# Patient Record
Sex: Female | Born: 1945 | Race: Black or African American | Hispanic: No | Smoking: Former smoker
Health system: Southern US, Community
[De-identification: ages and names within clinical notes are randomized; demographics above are authoritative.]

## PROBLEM LIST (undated history)

## (undated) DIAGNOSIS — R112 Nausea with vomiting, unspecified: Secondary | ICD-10-CM

## (undated) DIAGNOSIS — I10 Essential (primary) hypertension: Secondary | ICD-10-CM

## (undated) DIAGNOSIS — Z9889 Other specified postprocedural states: Secondary | ICD-10-CM

## (undated) DIAGNOSIS — J449 Chronic obstructive pulmonary disease, unspecified: Secondary | ICD-10-CM

## (undated) DIAGNOSIS — I509 Heart failure, unspecified: Secondary | ICD-10-CM

## (undated) DIAGNOSIS — K219 Gastro-esophageal reflux disease without esophagitis: Secondary | ICD-10-CM

## (undated) DIAGNOSIS — E039 Hypothyroidism, unspecified: Secondary | ICD-10-CM

## (undated) DIAGNOSIS — T8859XA Other complications of anesthesia, initial encounter: Secondary | ICD-10-CM

## (undated) DIAGNOSIS — D571 Sickle-cell disease without crisis: Secondary | ICD-10-CM

## (undated) DIAGNOSIS — E079 Disorder of thyroid, unspecified: Secondary | ICD-10-CM

## (undated) DIAGNOSIS — R0602 Shortness of breath: Secondary | ICD-10-CM

## (undated) DIAGNOSIS — T4145XA Adverse effect of unspecified anesthetic, initial encounter: Secondary | ICD-10-CM

## (undated) DIAGNOSIS — I639 Cerebral infarction, unspecified: Secondary | ICD-10-CM

## (undated) DIAGNOSIS — H6690 Otitis media, unspecified, unspecified ear: Secondary | ICD-10-CM

## (undated) DIAGNOSIS — E785 Hyperlipidemia, unspecified: Secondary | ICD-10-CM

## (undated) DIAGNOSIS — Z972 Presence of dental prosthetic device (complete) (partial): Secondary | ICD-10-CM

## (undated) DIAGNOSIS — Z973 Presence of spectacles and contact lenses: Secondary | ICD-10-CM

## (undated) DIAGNOSIS — K5730 Diverticulosis of large intestine without perforation or abscess without bleeding: Secondary | ICD-10-CM

## (undated) DIAGNOSIS — F329 Major depressive disorder, single episode, unspecified: Secondary | ICD-10-CM

## (undated) DIAGNOSIS — J349 Unspecified disorder of nose and nasal sinuses: Secondary | ICD-10-CM

## (undated) DIAGNOSIS — F32A Depression, unspecified: Secondary | ICD-10-CM

## (undated) DIAGNOSIS — F419 Anxiety disorder, unspecified: Secondary | ICD-10-CM

## (undated) HISTORY — DX: Hyperlipidemia, unspecified: E78.5

## (undated) HISTORY — PX: BILATERAL OOPHORECTOMY: SHX1221

## (undated) HISTORY — DX: Cerebral infarction, unspecified: I63.9

## (undated) HISTORY — DX: Heart failure, unspecified: I50.9

## (undated) HISTORY — PX: BLADDER SUSPENSION: SHX72

## (undated) HISTORY — PX: BREAST EXCISIONAL BIOPSY: SUR124

## (undated) HISTORY — DX: Otitis media, unspecified, unspecified ear: H66.90

## (undated) HISTORY — DX: Disorder of thyroid, unspecified: E07.9

## (undated) HISTORY — DX: Essential (primary) hypertension: I10

## (undated) HISTORY — PX: ABDOMINAL HYSTERECTOMY: SHX81

## (undated) HISTORY — DX: Diverticulosis of large intestine without perforation or abscess without bleeding: K57.30

## (undated) HISTORY — DX: Unspecified disorder of nose and nasal sinuses: J34.9

---

## 1983-09-05 HISTORY — PX: THYROIDECTOMY, PARTIAL: SHX18

## 1998-11-02 ENCOUNTER — Ambulatory Visit (HOSPITAL_COMMUNITY): Admission: RE | Admit: 1998-11-02 | Discharge: 1998-11-02 | Payer: Self-pay | Admitting: *Deleted

## 1998-11-02 ENCOUNTER — Encounter: Payer: Self-pay | Admitting: *Deleted

## 1998-11-03 ENCOUNTER — Ambulatory Visit (HOSPITAL_COMMUNITY): Admission: RE | Admit: 1998-11-03 | Discharge: 1998-11-03 | Payer: Self-pay | Admitting: *Deleted

## 1998-11-08 ENCOUNTER — Ambulatory Visit (HOSPITAL_COMMUNITY): Admission: RE | Admit: 1998-11-08 | Discharge: 1998-11-08 | Payer: Self-pay | Admitting: *Deleted

## 1998-11-08 ENCOUNTER — Other Ambulatory Visit: Admission: RE | Admit: 1998-11-08 | Discharge: 1998-11-08 | Payer: Self-pay | Admitting: Obstetrics

## 1998-11-25 ENCOUNTER — Encounter: Payer: Self-pay | Admitting: *Deleted

## 1998-11-25 ENCOUNTER — Ambulatory Visit (HOSPITAL_COMMUNITY): Admission: RE | Admit: 1998-11-25 | Discharge: 1998-11-25 | Payer: Self-pay | Admitting: *Deleted

## 2003-01-08 ENCOUNTER — Emergency Department (HOSPITAL_COMMUNITY): Admission: EM | Admit: 2003-01-08 | Discharge: 2003-01-08 | Payer: Self-pay | Admitting: Emergency Medicine

## 2003-06-03 ENCOUNTER — Encounter: Payer: Self-pay | Admitting: Internal Medicine

## 2003-06-03 ENCOUNTER — Ambulatory Visit (HOSPITAL_COMMUNITY): Admission: RE | Admit: 2003-06-03 | Discharge: 2003-06-03 | Payer: Self-pay | Admitting: Internal Medicine

## 2003-06-08 ENCOUNTER — Emergency Department (HOSPITAL_COMMUNITY): Admission: EM | Admit: 2003-06-08 | Discharge: 2003-06-08 | Payer: Self-pay | Admitting: Emergency Medicine

## 2003-06-08 ENCOUNTER — Encounter: Payer: Self-pay | Admitting: Emergency Medicine

## 2003-07-21 ENCOUNTER — Emergency Department (HOSPITAL_COMMUNITY): Admission: EM | Admit: 2003-07-21 | Discharge: 2003-07-21 | Payer: Self-pay | Admitting: Emergency Medicine

## 2003-08-07 ENCOUNTER — Ambulatory Visit (HOSPITAL_COMMUNITY): Admission: RE | Admit: 2003-08-07 | Discharge: 2003-08-07 | Payer: Self-pay | Admitting: Internal Medicine

## 2003-12-01 ENCOUNTER — Ambulatory Visit (HOSPITAL_COMMUNITY): Admission: RE | Admit: 2003-12-01 | Discharge: 2003-12-01 | Payer: Self-pay | Admitting: Internal Medicine

## 2004-02-24 ENCOUNTER — Ambulatory Visit (HOSPITAL_COMMUNITY): Admission: RE | Admit: 2004-02-24 | Discharge: 2004-02-24 | Payer: Self-pay | Admitting: Internal Medicine

## 2004-05-10 ENCOUNTER — Ambulatory Visit: Payer: Self-pay | Admitting: *Deleted

## 2004-06-03 ENCOUNTER — Ambulatory Visit: Payer: Self-pay | Admitting: Family Medicine

## 2004-07-14 ENCOUNTER — Ambulatory Visit: Payer: Self-pay | Admitting: Family Medicine

## 2004-12-14 ENCOUNTER — Ambulatory Visit: Payer: Self-pay | Admitting: Internal Medicine

## 2004-12-19 ENCOUNTER — Ambulatory Visit: Payer: Self-pay | Admitting: Family Medicine

## 2004-12-21 ENCOUNTER — Ambulatory Visit: Payer: Self-pay | Admitting: Family Medicine

## 2005-01-17 ENCOUNTER — Ambulatory Visit: Payer: Self-pay | Admitting: Family Medicine

## 2005-03-14 ENCOUNTER — Ambulatory Visit (HOSPITAL_COMMUNITY): Admission: RE | Admit: 2005-03-14 | Discharge: 2005-03-14 | Payer: Self-pay | Admitting: Internal Medicine

## 2005-04-27 ENCOUNTER — Ambulatory Visit: Payer: Self-pay | Admitting: Family Medicine

## 2005-05-03 ENCOUNTER — Ambulatory Visit: Payer: Self-pay | Admitting: Internal Medicine

## 2005-09-25 ENCOUNTER — Ambulatory Visit: Payer: Self-pay | Admitting: Family Medicine

## 2005-10-30 ENCOUNTER — Ambulatory Visit: Payer: Self-pay | Admitting: Family Medicine

## 2005-10-31 ENCOUNTER — Ambulatory Visit (HOSPITAL_COMMUNITY): Admission: RE | Admit: 2005-10-31 | Discharge: 2005-10-31 | Payer: Self-pay | Admitting: Internal Medicine

## 2006-03-22 ENCOUNTER — Ambulatory Visit: Payer: Self-pay | Admitting: Family Medicine

## 2006-04-04 ENCOUNTER — Ambulatory Visit: Payer: Self-pay | Admitting: Family Medicine

## 2006-04-16 ENCOUNTER — Ambulatory Visit: Payer: Self-pay | Admitting: Family Medicine

## 2006-05-28 ENCOUNTER — Ambulatory Visit: Payer: Self-pay | Admitting: Family Medicine

## 2006-06-04 ENCOUNTER — Ambulatory Visit (HOSPITAL_COMMUNITY): Admission: RE | Admit: 2006-06-04 | Discharge: 2006-06-04 | Payer: Self-pay | Admitting: Internal Medicine

## 2006-10-23 ENCOUNTER — Ambulatory Visit: Payer: Self-pay | Admitting: Family Medicine

## 2006-11-16 ENCOUNTER — Ambulatory Visit: Payer: Self-pay | Admitting: Family Medicine

## 2006-12-18 ENCOUNTER — Ambulatory Visit: Payer: Self-pay | Admitting: Family Medicine

## 2007-02-04 ENCOUNTER — Ambulatory Visit (HOSPITAL_COMMUNITY): Admission: RE | Admit: 2007-02-04 | Discharge: 2007-02-04 | Payer: Self-pay | Admitting: Urology

## 2007-02-12 ENCOUNTER — Ambulatory Visit: Payer: Self-pay | Admitting: Family Medicine

## 2007-03-07 DIAGNOSIS — I10 Essential (primary) hypertension: Secondary | ICD-10-CM | POA: Insufficient documentation

## 2007-03-07 DIAGNOSIS — E039 Hypothyroidism, unspecified: Secondary | ICD-10-CM | POA: Insufficient documentation

## 2007-03-07 DIAGNOSIS — J309 Allergic rhinitis, unspecified: Secondary | ICD-10-CM | POA: Insufficient documentation

## 2007-03-07 DIAGNOSIS — E785 Hyperlipidemia, unspecified: Secondary | ICD-10-CM | POA: Insufficient documentation

## 2007-03-07 DIAGNOSIS — F411 Generalized anxiety disorder: Secondary | ICD-10-CM | POA: Insufficient documentation

## 2007-03-07 DIAGNOSIS — J45909 Unspecified asthma, uncomplicated: Secondary | ICD-10-CM | POA: Insufficient documentation

## 2007-03-07 DIAGNOSIS — K219 Gastro-esophageal reflux disease without esophagitis: Secondary | ICD-10-CM | POA: Insufficient documentation

## 2007-03-07 DIAGNOSIS — F329 Major depressive disorder, single episode, unspecified: Secondary | ICD-10-CM | POA: Insufficient documentation

## 2007-03-20 ENCOUNTER — Ambulatory Visit: Payer: Self-pay | Admitting: Internal Medicine

## 2007-03-27 ENCOUNTER — Ambulatory Visit (HOSPITAL_COMMUNITY): Admission: RE | Admit: 2007-03-27 | Discharge: 2007-03-27 | Payer: Self-pay | Admitting: Internal Medicine

## 2007-04-05 ENCOUNTER — Ambulatory Visit: Payer: Self-pay | Admitting: Internal Medicine

## 2007-05-01 ENCOUNTER — Ambulatory Visit: Payer: Self-pay | Admitting: Obstetrics & Gynecology

## 2007-05-03 ENCOUNTER — Ambulatory Visit (HOSPITAL_COMMUNITY): Admission: RE | Admit: 2007-05-03 | Discharge: 2007-05-03 | Payer: Self-pay | Admitting: Family Medicine

## 2007-05-08 ENCOUNTER — Ambulatory Visit: Payer: Self-pay | Admitting: *Deleted

## 2007-05-22 ENCOUNTER — Encounter (INDEPENDENT_AMBULATORY_CARE_PROVIDER_SITE_OTHER): Payer: Self-pay | Admitting: *Deleted

## 2007-06-07 ENCOUNTER — Ambulatory Visit (HOSPITAL_COMMUNITY): Admission: RE | Admit: 2007-06-07 | Discharge: 2007-06-07 | Payer: Self-pay | Admitting: Family Medicine

## 2007-07-04 ENCOUNTER — Encounter (INDEPENDENT_AMBULATORY_CARE_PROVIDER_SITE_OTHER): Payer: Self-pay | Admitting: Family Medicine

## 2007-07-04 ENCOUNTER — Ambulatory Visit: Payer: Self-pay | Admitting: Internal Medicine

## 2007-07-04 LAB — CONVERTED CEMR LAB
Albumin: 4.4 g/dL (ref 3.5–5.2)
BUN: 14 mg/dL (ref 6–23)
Basophils Absolute: 0.1 10*3/uL (ref 0.0–0.1)
CO2: 22 meq/L (ref 19–32)
Calcium: 9.2 mg/dL (ref 8.4–10.5)
Chloride: 105 meq/L (ref 96–112)
Eosinophils Relative: 2 % (ref 0–5)
HCT: 45.2 % (ref 36.0–46.0)
HDL: 37 mg/dL — ABNORMAL LOW (ref >39)
Hemoglobin: 14.3 g/dL (ref 12.0–15.0)
MCHC: 31.6 g/dL (ref 30.0–36.0)
MCV: 91.3 fL (ref 78.0–100.0)
Neutro Abs: 3.3 10*3/uL (ref 1.7–7.7)
Neutrophils Relative %: 38 % — ABNORMAL LOW (ref 43–77)
Platelets: 218 10*3/uL (ref 150–400)
Potassium: 4.2 meq/L (ref 3.5–5.3)
RBC: 4.95 M/uL (ref 3.87–5.11)
RDW: 14.2 % — ABNORMAL HIGH (ref 11.5–14.0)
Sodium: 140 meq/L (ref 135–145)
Total CHOL/HDL Ratio: 7
Total Protein: 7.4 g/dL (ref 6.0–8.3)
Triglycerides: 183 mg/dL — ABNORMAL HIGH (ref <150)
WBC: 8.8 10*3/uL (ref 4.0–10.5)

## 2007-07-29 ENCOUNTER — Ambulatory Visit: Payer: Self-pay | Admitting: Internal Medicine

## 2007-11-06 ENCOUNTER — Ambulatory Visit (HOSPITAL_COMMUNITY): Admission: RE | Admit: 2007-11-06 | Discharge: 2007-11-06 | Payer: Self-pay | Admitting: Obstetrics & Gynecology

## 2007-11-06 ENCOUNTER — Ambulatory Visit: Payer: Self-pay | Admitting: Obstetrics & Gynecology

## 2007-12-23 ENCOUNTER — Encounter (INDEPENDENT_AMBULATORY_CARE_PROVIDER_SITE_OTHER): Payer: Self-pay | Admitting: Family Medicine

## 2007-12-23 ENCOUNTER — Ambulatory Visit: Payer: Self-pay | Admitting: Internal Medicine

## 2007-12-23 LAB — CONVERTED CEMR LAB
AST: 18 units/L (ref 0–37)
Albumin: 4.2 g/dL (ref 3.5–5.2)
BUN: 13 mg/dL (ref 6–23)
CO2: 23 meq/L (ref 19–32)
Calcium: 9.5 mg/dL (ref 8.4–10.5)
Creatinine, Ser: 0.8 mg/dL (ref 0.40–1.20)
Eosinophils Absolute: 0.3 10*3/uL (ref 0.0–0.7)
HDL: 40 mg/dL (ref >39)
Helicobacter Pylori Antibody-IgG: 3.1 — ABNORMAL HIGH
LDL Cholesterol: 134 mg/dL — ABNORMAL HIGH (ref 0–99)
Lymphocytes Relative: 50 % — ABNORMAL HIGH (ref 12–46)
Lymphs Abs: 3.9 10*3/uL (ref 0.7–4.0)
MCHC: 32.7 g/dL (ref 30.0–36.0)
Monocytes Relative: 3 % (ref 3–12)
Neutrophils Relative %: 42 % — ABNORMAL LOW (ref 43–77)
Platelets: 198 10*3/uL (ref 150–400)
Potassium: 4.1 meq/L (ref 3.5–5.3)
RBC: 4.59 M/uL (ref 3.87–5.11)
RDW: 13.2 % (ref 11.5–15.5)
Sodium: 142 meq/L (ref 135–145)
Total Bilirubin: 0.4 mg/dL (ref 0.3–1.2)
Total CHOL/HDL Ratio: 5.2
Triglycerides: 162 mg/dL — ABNORMAL HIGH (ref <150)

## 2007-12-31 ENCOUNTER — Encounter: Payer: Self-pay | Admitting: Obstetrics & Gynecology

## 2007-12-31 ENCOUNTER — Ambulatory Visit: Payer: Self-pay | Admitting: Obstetrics & Gynecology

## 2007-12-31 ENCOUNTER — Inpatient Hospital Stay (HOSPITAL_COMMUNITY): Admission: RE | Admit: 2007-12-31 | Discharge: 2008-01-02 | Payer: Self-pay | Admitting: Obstetrics & Gynecology

## 2008-01-08 ENCOUNTER — Ambulatory Visit: Payer: Self-pay | Admitting: Obstetrics and Gynecology

## 2008-01-23 ENCOUNTER — Ambulatory Visit: Payer: Self-pay | Admitting: Obstetrics & Gynecology

## 2008-01-31 ENCOUNTER — Ambulatory Visit (HOSPITAL_COMMUNITY): Admission: RE | Admit: 2008-01-31 | Discharge: 2008-01-31 | Payer: Self-pay | Admitting: Family Medicine

## 2008-02-13 ENCOUNTER — Encounter (INDEPENDENT_AMBULATORY_CARE_PROVIDER_SITE_OTHER): Payer: Self-pay | Admitting: Family Medicine

## 2008-02-13 ENCOUNTER — Ambulatory Visit: Payer: Self-pay | Admitting: Internal Medicine

## 2008-02-13 LAB — CONVERTED CEMR LAB
Free T4: 1.22 ng/dL (ref 0.89–1.80)
HCV Ab: NEGATIVE
Hep A Total Ab: POSITIVE — AB
Sed Rate: 22 mm/hr (ref 0–22)
TSH: 0.877 microintl units/mL (ref 0.350–5.50)

## 2008-02-14 ENCOUNTER — Emergency Department (HOSPITAL_COMMUNITY): Admission: EM | Admit: 2008-02-14 | Discharge: 2008-02-14 | Payer: Self-pay | Admitting: Emergency Medicine

## 2008-03-09 ENCOUNTER — Emergency Department (HOSPITAL_COMMUNITY): Admission: EM | Admit: 2008-03-09 | Discharge: 2008-03-09 | Payer: Self-pay | Admitting: Emergency Medicine

## 2008-03-10 ENCOUNTER — Ambulatory Visit: Payer: Self-pay | Admitting: Internal Medicine

## 2008-03-23 ENCOUNTER — Ambulatory Visit: Payer: Self-pay | Admitting: Internal Medicine

## 2008-04-24 ENCOUNTER — Ambulatory Visit: Payer: Self-pay | Admitting: Internal Medicine

## 2008-05-25 ENCOUNTER — Encounter (INDEPENDENT_AMBULATORY_CARE_PROVIDER_SITE_OTHER): Payer: Self-pay | Admitting: Family Medicine

## 2008-05-25 ENCOUNTER — Ambulatory Visit: Payer: Self-pay | Admitting: Internal Medicine

## 2008-05-25 LAB — CONVERTED CEMR LAB
Alkaline Phosphatase: 89 units/L (ref 39–117)
CO2: 23 meq/L (ref 19–32)
Cholesterol: 118 mg/dL (ref 0–200)
Creatinine, Ser: 0.86 mg/dL (ref 0.40–1.20)
Glucose, Bld: 128 mg/dL — ABNORMAL HIGH (ref 70–99)
HDL: 35 mg/dL — ABNORMAL LOW (ref >39)
LDL Cholesterol: 63 mg/dL (ref 0–99)
Total Bilirubin: 0.4 mg/dL (ref 0.3–1.2)
Total CHOL/HDL Ratio: 3.4
Triglycerides: 100 mg/dL (ref <150)
VLDL: 20 mg/dL (ref 0–40)

## 2008-05-28 ENCOUNTER — Ambulatory Visit (HOSPITAL_COMMUNITY): Admission: RE | Admit: 2008-05-28 | Discharge: 2008-05-28 | Payer: Self-pay | Admitting: Internal Medicine

## 2008-06-09 ENCOUNTER — Ambulatory Visit (HOSPITAL_COMMUNITY): Admission: RE | Admit: 2008-06-09 | Discharge: 2008-06-09 | Payer: Self-pay | Admitting: Family Medicine

## 2008-06-15 ENCOUNTER — Ambulatory Visit: Payer: Self-pay | Admitting: Adult Health

## 2008-06-22 ENCOUNTER — Ambulatory Visit: Payer: Self-pay | Admitting: Adult Health

## 2008-06-22 LAB — CONVERTED CEMR LAB
ALT: 19 units/L (ref 0–35)
AST: 14 units/L (ref 0–37)
Albumin: 4.4 g/dL (ref 3.5–5.2)
Alkaline Phosphatase: 91 units/L (ref 39–117)
BUN: 17 mg/dL (ref 6–23)
Basophils Absolute: 0.1 10*3/uL (ref 0.0–0.1)
Basophils Relative: 1 % (ref 0–1)
Calcium: 9 mg/dL (ref 8.4–10.5)
Chloride: 106 meq/L (ref 96–112)
Eosinophils Absolute: 0.2 10*3/uL (ref 0.0–0.7)
HDL: 35 mg/dL — ABNORMAL LOW (ref >39)
LDL Cholesterol: 63 mg/dL (ref 0–99)
MCHC: 30.1 g/dL (ref 30.0–36.0)
MCV: 89.8 fL (ref 78.0–100.0)
Microalb, Ur: 0.89 mg/dL (ref 0.00–1.89)
Neutro Abs: 3.7 10*3/uL (ref 1.7–7.7)
Neutrophils Relative %: 45 % (ref 43–77)
Platelets: 214 10*3/uL (ref 150–400)
Potassium: 4.1 meq/L (ref 3.5–5.3)
RDW: 13.8 % (ref 11.5–15.5)
T4, Total: 9.1 ug/dL (ref 5.0–12.5)
TSH: 1.225 microintl units/mL (ref 0.350–4.50)
Total CHOL/HDL Ratio: 3.6

## 2008-07-23 ENCOUNTER — Ambulatory Visit: Payer: Self-pay | Admitting: Internal Medicine

## 2008-08-12 ENCOUNTER — Ambulatory Visit (HOSPITAL_COMMUNITY): Admission: RE | Admit: 2008-08-12 | Discharge: 2008-08-12 | Payer: Self-pay | Admitting: Internal Medicine

## 2008-08-12 ENCOUNTER — Encounter: Payer: Self-pay | Admitting: Pulmonary Disease

## 2008-08-12 ENCOUNTER — Ambulatory Visit: Payer: Self-pay | Admitting: Cardiovascular Disease

## 2008-08-12 ENCOUNTER — Encounter: Payer: Self-pay | Admitting: Internal Medicine

## 2008-09-09 ENCOUNTER — Encounter (INDEPENDENT_AMBULATORY_CARE_PROVIDER_SITE_OTHER): Payer: Self-pay | Admitting: Adult Health

## 2008-09-09 ENCOUNTER — Ambulatory Visit: Payer: Self-pay | Admitting: Internal Medicine

## 2008-09-09 LAB — CONVERTED CEMR LAB
ALT: 22 units/L (ref 0–35)
AST: 16 units/L (ref 0–37)
Alkaline Phosphatase: 90 units/L (ref 39–117)
Basophils Absolute: 0.1 10*3/uL (ref 0.0–0.1)
Basophils Relative: 1 % (ref 0–1)
Cholesterol: 189 mg/dL (ref 0–200)
Creatinine, Ser: 0.67 mg/dL (ref 0.40–1.20)
Eosinophils Relative: 3 % (ref 0–5)
HCT: 41 % (ref 36.0–46.0)
Hemoglobin: 12.8 g/dL (ref 12.0–15.0)
LDL Cholesterol: 110 mg/dL — ABNORMAL HIGH (ref 0–99)
MCHC: 31.2 g/dL (ref 30.0–36.0)
Monocytes Absolute: 0.5 10*3/uL (ref 0.1–1.0)
Platelets: 213 10*3/uL (ref 150–400)
RDW: 14.1 % (ref 11.5–15.5)
Sodium: 142 meq/L (ref 135–145)
T3 Uptake Ratio: 27.7 % (ref 22.5–37.0)
T4, Total: 9.5 ug/dL (ref 5.0–12.5)
TSH: 1.72 microintl units/mL (ref 0.350–4.50)
Total Bilirubin: 0.4 mg/dL (ref 0.3–1.2)
Total CHOL/HDL Ratio: 5.3
Total Protein: 7.6 g/dL (ref 6.0–8.3)
VLDL: 43 mg/dL — ABNORMAL HIGH (ref 0–40)
Vit D, 1,25-Dihydroxy: 32 (ref 30–89)

## 2008-09-10 ENCOUNTER — Ambulatory Visit (HOSPITAL_COMMUNITY): Admission: RE | Admit: 2008-09-10 | Discharge: 2008-09-10 | Payer: Self-pay | Admitting: Internal Medicine

## 2008-09-30 ENCOUNTER — Ambulatory Visit: Payer: Self-pay | Admitting: Family Medicine

## 2008-10-08 ENCOUNTER — Ambulatory Visit: Payer: Self-pay | Admitting: Internal Medicine

## 2009-04-13 ENCOUNTER — Ambulatory Visit: Payer: Self-pay | Admitting: Internal Medicine

## 2009-04-13 ENCOUNTER — Encounter (INDEPENDENT_AMBULATORY_CARE_PROVIDER_SITE_OTHER): Payer: Self-pay | Admitting: Adult Health

## 2009-04-13 LAB — CONVERTED CEMR LAB
ALT: 14 units/L (ref 0–35)
Alkaline Phosphatase: 94 units/L (ref 39–117)
Basophils Absolute: 0 10*3/uL (ref 0.0–0.1)
Basophils Relative: 1 % (ref 0–1)
CO2: 23 meq/L (ref 19–32)
Cholesterol: 173 mg/dL (ref 0–200)
Creatinine, Ser: 0.8 mg/dL (ref 0.40–1.20)
Eosinophils Absolute: 0.2 10*3/uL (ref 0.0–0.7)
Glucose, Bld: 101 mg/dL — ABNORMAL HIGH (ref 70–99)
MCHC: 30.4 g/dL (ref 30.0–36.0)
MCV: 88.4 fL (ref 78.0–100.0)
Monocytes Relative: 4 % (ref 3–12)
Neutrophils Relative %: 42 % — ABNORMAL LOW (ref 43–77)
RBC: 4.65 M/uL (ref 3.87–5.11)
RDW: 14.6 % (ref 11.5–15.5)
Total Bilirubin: 0.4 mg/dL (ref 0.3–1.2)
Total CHOL/HDL Ratio: 4.6
Triglycerides: 165 mg/dL — ABNORMAL HIGH (ref <150)
VLDL: 33 mg/dL (ref 0–40)

## 2009-04-15 ENCOUNTER — Ambulatory Visit (HOSPITAL_COMMUNITY): Admission: RE | Admit: 2009-04-15 | Discharge: 2009-04-15 | Payer: Self-pay | Admitting: Internal Medicine

## 2009-05-04 ENCOUNTER — Ambulatory Visit: Payer: Self-pay | Admitting: Internal Medicine

## 2009-06-02 ENCOUNTER — Ambulatory Visit: Payer: Self-pay | Admitting: Obstetrics and Gynecology

## 2009-06-04 ENCOUNTER — Ambulatory Visit: Payer: Self-pay | Admitting: Internal Medicine

## 2009-06-07 ENCOUNTER — Inpatient Hospital Stay (HOSPITAL_COMMUNITY): Admission: AD | Admit: 2009-06-07 | Discharge: 2009-06-07 | Payer: Self-pay | Admitting: Family Medicine

## 2009-06-09 ENCOUNTER — Ambulatory Visit: Payer: Self-pay | Admitting: Obstetrics & Gynecology

## 2009-06-11 ENCOUNTER — Ambulatory Visit (HOSPITAL_COMMUNITY): Admission: RE | Admit: 2009-06-11 | Discharge: 2009-06-11 | Payer: Self-pay | Admitting: Family Medicine

## 2009-06-23 ENCOUNTER — Ambulatory Visit: Payer: Self-pay | Admitting: Obstetrics & Gynecology

## 2009-07-05 ENCOUNTER — Ambulatory Visit: Payer: Self-pay | Admitting: Internal Medicine

## 2009-07-05 ENCOUNTER — Encounter (INDEPENDENT_AMBULATORY_CARE_PROVIDER_SITE_OTHER): Payer: Self-pay | Admitting: Adult Health

## 2009-07-05 LAB — CONVERTED CEMR LAB
Albumin: 4.2 g/dL (ref 3.5–5.2)
CO2: 26 meq/L (ref 19–32)
Calcium: 9.2 mg/dL (ref 8.4–10.5)
Cholesterol: 190 mg/dL (ref 0–200)
Glucose, Bld: 94 mg/dL (ref 70–99)
Potassium: 4.1 meq/L (ref 3.5–5.3)
Sodium: 142 meq/L (ref 135–145)
TSH: 1.242 microintl units/mL (ref 0.350–4.500)
Total Protein: 7.2 g/dL (ref 6.0–8.3)
Triglycerides: 288 mg/dL — ABNORMAL HIGH (ref <150)

## 2009-09-30 ENCOUNTER — Ambulatory Visit: Payer: Self-pay | Admitting: Obstetrics & Gynecology

## 2009-10-21 ENCOUNTER — Ambulatory Visit: Payer: Self-pay | Admitting: Internal Medicine

## 2009-10-22 ENCOUNTER — Encounter (INDEPENDENT_AMBULATORY_CARE_PROVIDER_SITE_OTHER): Payer: Self-pay | Admitting: Adult Health

## 2009-10-22 ENCOUNTER — Ambulatory Visit: Payer: Self-pay | Admitting: Internal Medicine

## 2009-10-22 LAB — CONVERTED CEMR LAB
ALT: 14 units/L (ref 0–35)
AST: 14 units/L (ref 0–37)
Albumin: 4.1 g/dL (ref 3.5–5.2)
BUN: 14 mg/dL (ref 6–23)
Calcium: 9.3 mg/dL (ref 8.4–10.5)
Chloride: 101 meq/L (ref 96–112)
Eosinophils Relative: 3 % (ref 0–5)
HCT: 41.8 % (ref 36.0–46.0)
Hemoglobin: 12.8 g/dL (ref 12.0–15.0)
Lymphocytes Relative: 51 % — ABNORMAL HIGH (ref 12–46)
Lymphs Abs: 4.7 10*3/uL — ABNORMAL HIGH (ref 0.7–4.0)
Platelets: 261 10*3/uL (ref 150–400)
Potassium: 3.8 meq/L (ref 3.5–5.3)
Total Protein: 7 g/dL (ref 6.0–8.3)
WBC: 9.3 10*3/uL (ref 4.0–10.5)

## 2009-10-29 ENCOUNTER — Ambulatory Visit (HOSPITAL_COMMUNITY): Admission: RE | Admit: 2009-10-29 | Discharge: 2009-10-29 | Payer: Self-pay | Admitting: Internal Medicine

## 2009-10-29 ENCOUNTER — Ambulatory Visit: Payer: Self-pay | Admitting: Internal Medicine

## 2009-12-06 ENCOUNTER — Ambulatory Visit: Payer: Self-pay | Admitting: Internal Medicine

## 2009-12-10 ENCOUNTER — Ambulatory Visit (HOSPITAL_COMMUNITY): Admission: RE | Admit: 2009-12-10 | Discharge: 2009-12-10 | Payer: Self-pay | Admitting: Internal Medicine

## 2010-01-03 ENCOUNTER — Telehealth: Payer: Self-pay | Admitting: Pulmonary Disease

## 2010-01-03 ENCOUNTER — Ambulatory Visit: Payer: Self-pay | Admitting: Pulmonary Disease

## 2010-01-03 DIAGNOSIS — R05 Cough: Secondary | ICD-10-CM | POA: Insufficient documentation

## 2010-01-03 DIAGNOSIS — R059 Cough, unspecified: Secondary | ICD-10-CM | POA: Insufficient documentation

## 2010-03-04 ENCOUNTER — Ambulatory Visit: Payer: Self-pay | Admitting: Internal Medicine

## 2010-03-04 ENCOUNTER — Encounter (INDEPENDENT_AMBULATORY_CARE_PROVIDER_SITE_OTHER): Payer: Self-pay | Admitting: Adult Health

## 2010-03-04 LAB — CONVERTED CEMR LAB
AST: 16 units/L (ref 0–37)
Alkaline Phosphatase: 70 units/L (ref 39–117)
BUN: 18 mg/dL (ref 6–23)
Basophils Absolute: 0 10*3/uL (ref 0.0–0.1)
Basophils Relative: 1 % (ref 0–1)
Creatinine, Ser: 0.82 mg/dL (ref 0.40–1.20)
Eosinophils Absolute: 0.2 10*3/uL (ref 0.0–0.7)
Eosinophils Relative: 2 % (ref 0–5)
Glucose, Bld: 122 mg/dL — ABNORMAL HIGH (ref 70–99)
HCT: 41.1 % (ref 36.0–46.0)
HDL: 36 mg/dL — ABNORMAL LOW (ref >39)
LDL Cholesterol: 112 mg/dL — ABNORMAL HIGH (ref 0–99)
MCHC: 29.9 g/dL — ABNORMAL LOW (ref 30.0–36.0)
MCV: 90.3 fL (ref 78.0–100.0)
Platelets: 236 10*3/uL (ref 150–400)
RDW: 15.4 % (ref 11.5–15.5)
TSH: 1.396 microintl units/mL (ref 0.350–4.500)
Total CHOL/HDL Ratio: 4.9
Triglycerides: 149 mg/dL (ref <150)
Vit D, 25-Hydroxy: 36 ng/mL (ref 30–89)

## 2010-04-04 ENCOUNTER — Ambulatory Visit: Payer: Self-pay | Admitting: Pulmonary Disease

## 2010-04-05 ENCOUNTER — Ambulatory Visit: Payer: Self-pay | Admitting: Cardiology

## 2010-06-22 ENCOUNTER — Ambulatory Visit (HOSPITAL_COMMUNITY): Admission: RE | Admit: 2010-06-22 | Discharge: 2010-06-22 | Payer: Self-pay | Admitting: Internal Medicine

## 2010-07-27 ENCOUNTER — Ambulatory Visit: Payer: Self-pay | Admitting: Obstetrics & Gynecology

## 2010-07-28 ENCOUNTER — Encounter: Payer: Self-pay | Admitting: Obstetrics & Gynecology

## 2010-07-28 LAB — CONVERTED CEMR LAB: Trich, Wet Prep: NONE SEEN

## 2010-08-24 ENCOUNTER — Encounter (INDEPENDENT_AMBULATORY_CARE_PROVIDER_SITE_OTHER): Payer: Self-pay | Admitting: *Deleted

## 2010-08-24 LAB — CONVERTED CEMR LAB
ALT: 22 units/L (ref 0–35)
Alkaline Phosphatase: 90 units/L (ref 39–117)
CO2: 27 meq/L (ref 19–32)
Cholesterol: 162 mg/dL (ref 0–200)
Creatinine, Ser: 0.84 mg/dL (ref 0.40–1.20)
LDL Cholesterol: 93 mg/dL (ref 0–99)
Microalb, Ur: 1.23 mg/dL (ref 0.00–1.89)
Sodium: 140 meq/L (ref 135–145)
Total Bilirubin: 0.4 mg/dL (ref 0.3–1.2)
Total CHOL/HDL Ratio: 4.9
Total Protein: 7.8 g/dL (ref 6.0–8.3)
Triglycerides: 181 mg/dL — ABNORMAL HIGH (ref <150)
VLDL: 36 mg/dL (ref 0–40)

## 2010-09-25 ENCOUNTER — Encounter: Payer: Self-pay | Admitting: Family Medicine

## 2010-10-04 NOTE — Assessment & Plan Note (Signed)
Summary: per pt call/cb   Visit Type:  Follow-up Primary Provider/Referring Provider:  [facility]  CC:  HAVE NOT BEEN ABLE TO TASTE OR SMELL SINCE TAKING MUCINEX, STOPPED TAKIING IN MAY, STILL HAVE COUGH WITH CLEAR PRODUCTION  PT C/O EAR DRAINAGE BOTH EARS  AND THROAT ITCHING. VENTOLIN INHALER GAVE PT THRUSH, and USED MMW AND AMOX WHICH CLEARED SYMPTOMS AFTER FINISHING SYMPTOMS CAME BACK .  History of Present Illness: 01/03/10 64/F, ex smoker referred for evaluation of chronic cough since 2007. She smoked 20 Pyrs ,about 1/2 PPD before quitting in 2007. She rertpos congestion with 'mucus build-up' x 31/2 years intermittently & constant for the last year. Symbicort, ventolin & atrovent MDIs have not helped. She has taken 3 courses of antibiotics this year already with thrush & vaginal yeast infections. SHe reports itching in her ears & throat & takes xyzal for seasonal alergies in the spring & fall. She has been on protonix x 1.5 yrs & denies breakthrough reflux . She has partial dentures & wonders if the adhesive  - FIXOdent - could be causing the problem - she has tried various brands without relief. She denies childhood h/oa sthma or seasonal variations in symptoms.  CXR 10/29/09 wnl, CT neck  - clear visualised sinuses, esophagus nml. Echo - EF 60%, mildly calcified aortic valve. Spirometry >> normal lung function. Imp -Likely a combination of upper airway cough & GERD. No evidence of asthma or significant COPD in this ex smoker with nml CXR. Trial of zyrtec D, Stay on PPI - protonix Use mucinex as needed  Stop symbicort & atrovent, use ventolin as needed for bronchospasm  April 04, 2010 11:14 AM  Cough no better, developed thrush from 'ventolin', mucinex made her lose taste, c/o ear drumming & throat itching  Current Medications (verified): 1)  Levothroid 112 Mcg  Tabs (Levothyroxine Sodium) .... One Daily 2)  Protonix 40 Mg  Tbec (Pantoprazole Sodium) .... One Pill Daily 3)  Detrol La 4 Mg   Cp24 (Tolterodine Tartrate) .... One Pill At [facility] 4)  Xyzal 5 Mg Tabs (Levocetirizine Dihydrochloride) .... Take 1 Tablet By Mouth Once A Day 5)  Actos 30 Mg Tabs (Pioglitazone Hcl) .... Take 1 Tablet By Mouth Once A Day 6)  Norvasc 5 Mg Tabs (Amlodipine Besylate) .... Take 1 Tablet By Mouth Once A Day 7)  Amaryl 4 Mg Tabs (Glimepiride) .... Take 1 Tablet By Mouth Two Times A Day 8)  Pravastatin Sodium 20 Mg Tabs (Pravastatin Sodium) .... Take 1 Tab By Mouth At Bedtime 9)  Aspirin 81 Mg  Tabs (Aspirin) .... Take 1 Tablet By Mouth Once A Day 10)  Avapro 150 Mg Tabs (Irbesartan) .... Take 1 Tablet By Mouth Once A Day With Hctz 11)  Hydrochlorothiazide 25 Mg Tabs (Hydrochlorothiazide) .... Take 1 Tablet By Mouth Once A Day With Avapro  Allergies: 1)  ! Codeine 2)  ! Sulfa  Past History:  Past Medical History: Last updated: 03/07/2007 Allergic rhinitis Anxiety Asthma Depression GERD Hyperlipidemia Hypertension Hypothyroidism Bilateral Ovarian Cysts Cystocele Constipation  Social History: Last updated: 01/03/2010 Marital Status: widow Children: yes, 1 Occupation: retired, Engineering geologist Patient states former smoker - 1/2 PPD since age 65 to 10 yrs  Review of Systems       The patient complains of prolonged cough.  The patient denies anorexia, fever, weight loss, weight gain, vision loss, decreased hearing, hoarseness, chest pain, syncope, dyspnea on exertion, peripheral edema, headaches, hemoptysis, abdominal pain, melena, hematochezia, severe indigestion/heartburn, hematuria, muscle weakness,  suspicious skin lesions, difficulty walking, depression, unusual weight change, and abnormal bleeding.    Vital Signs:  Patient profile:   65 year old female Height:      65 inches Weight:      225.50 pounds BMI:     37.66 O2 Sat:      97 % on Room air Temp:     98.1 degrees F oral Pulse rate:   72 / minute BP sitting:   114 / 82  (right arm) Cuff size:   large  Vitals Entered By: Kandice Hams CMA (April 04, 2010 10:59 AM)  O2 Flow:  Room air CC: HAVE NOT BEEN ABLE TO TASTE OR SMELL SINCE TAKING MUCINEX, STOPPED TAKIING IN MAY, STILL HAVE COUGH WITH CLEAR PRODUCTION  PT C/O EAR DRAINAGE BOTH EARS  AND THROAT ITCHING. VENTOLIN INHALER GAVE PT THRUSH, USED MMW AND AMOX WHICH CLEARED SYMPTOMS AFTER FINISHING SYMPTOMS CAME BACK    Physical Exam  Additional Exam:  Gen. Pleasant, well-nourished, in no distress, normal affect ENT - no lesions, no post nasal drip, no thrush Neck: No JVD, no thyromegaly, no carotid bruits Lungs: no use of accessory muscles, no dullness to percussion, clear without rales or rhonchi  Cardiovascular: Rhythm regular, heart sounds  normal, no murmurs or gallops, no peripheral edema Musculoskeletal: No deformities, no cyanosis or clubbing      Impression & Recommendations:  Problem # 1:  COUGH (ICD-786.2) Still favor upper airway with GERD contributing. Limited sinus CT - ENt referral for ear & throat sympoms. Trial of dexilant INSTEAD of protonix Symptomatic cough suppressants - hydrocodone at bedtime & benzonatate daytime.  Orders: T-"RAST" (Allergy Full Profile) IGE ([id number]) Est. Patient Level IV (21308) Prescription Created Electronically [id number]) ENT Referral (ENT) Radiology Referral (Radiology)  Medications Added to Medication List This Visit: 1)  Benzonatate 200 Mg Caps (Benzonatate) .... Three times a day as needed 2)  Tussionex Pennkinetic Er 8-10 Mg/6ml Lqcr (Chlorpheniramine-hydrocodone) .... 2.5 - 5ml by mouth two times a day  Patient Instructions: 1)  Copy sent to: [facility] 2)  Please schedule a follow-up appointment in 1 month. 3)  It is recommended that you schedule a consultation with an ENT specialist: 4)  A Limited Sinus CT has been recommended.  Your imaging study may require preauthorization.  5)  Cough syrup containing codeine like drug - DO NOT DRIVE after taking this 6)  COugh perles three times a day as  needed  7)  trial of dexilant (samples ) instead of protonix 8)  Blood work for allergies Prescriptions: TUSSIONEX PENNKINETIC ER 8-10 MG/5ML LQCR (CHLORPHENIRAMINE-HYDROCODONE) 2.5 - 5ml by mouth two times a day  #75 x 0   Entered and Authorized by:   Comer Locket Vassie Loll MD   Signed by:   Comer Locket Vassie Loll MD on 04/04/2010   Method used:   Printed then faxed to ...       [facility] - [facility] (retail)       [address] [place], [place]  [address]       Ph: [phone]       Fax: [phone]   RxID:   [id number] BENZONATATE 200 MG CAPS (BENZONATATE) three times a day as needed  #45 x 1   Entered and Authorized by:   Comer Locket. Vassie Loll MD   Signed by:   Comer Locket Vassie Loll MD on 04/04/2010   Method used:   Printed then faxed to .Marland KitchenMarland Kitchen  [facility] - [facility] (retail)       [address] [place], [place]  [address]       Ph: [phone]       Fax: [phone]   RxID:   [id number]     Appended Document: per pt call/cb let her know- allergy test ok, CT - mild sinus affection - no change in plan  Appended Document: per pt call/cb pt infomed. jwr

## 2010-10-04 NOTE — Assessment & Plan Note (Signed)
Summary: COPD/ CHRONIC COUGH///kp   Visit Type:  Initial Consult  CC:  [name] here for pulmonary consult. [name] c/o chest congestion and thick clear mucus all the time.  History of Present Illness: 65/F, ex smoker referred for evaluation of chronic cough since 2007. [name] smoked 20 Pyrs ,about 1/2 PPD before quitting in 2007. [name] rertpos congestion with 'mucus build-up' x 31/2 years intermittently & constant for the last year. Symbicort, ventolin & atrovent MDIs have not helped. [name] has taken 3 courses of antibiotics this year already with thrush & vaginal yeast infections. [name] reports itching in her ears & throat & takes xyzal for seasonal alergies in the spring & fall. [name] has been on protonix x 1.5 yrs & denies breakthrough reflux . [name] has partial dentures & wonders if the adhesive  - FIXOdent - could be causing the problem - [name] has tried various brands without relief. [name] denies childhood h/oa sthma or seasonal variations in symptoms.  CXR 10/29/09 wnl, CT neck  - clear visualised sinuses, esophagus nml. Echo - EF 60%, mildly calcified aortic valve. Spirometry >> normal lung function.  Preventive Screening-Counseling & Management  Alcohol-Tobacco     Smoking Status: quit  Current Medications (verified): 1)  Levothroid 112 Mcg  Tabs (Levothyroxine Sodium) .... One Daily 2)  Protonix 40 Mg  Tbec (Pantoprazole Sodium) .... One Pill Daily 3)  Detrol La 4 Mg  Cp24 (Tolterodine Tartrate) .... One Pill At [facility] 4)  Flonase 50 Mcg/act  Susp (Fluticasone Propionate) .... 2 Sprays in Each Nostril Daily 5)  Symbicort 80-4.5 Mcg/act Aero (Budesonide-Formoterol Fumarate) .... Inhale 2 Puffs Two Times A Day 6)  Ventolin Hfa 108 (90 Base) Mcg/act Aers (Albuterol Sulfate) .... As Needed 7)  Atrovent Hfa 17 Mcg/act Aers (Ipratropium Bromide Hfa) .... Use Two Puffs Four Times A Day 8)  Xyzal 5 Mg Tabs (Levocetirizine Dihydrochloride) .... Take 1 Tablet By Mouth Once A Day 9)  Actos 30 Mg Tabs (Pioglitazone Hcl)  .... Take 1 Tablet By Mouth Once A Day 10)  Norvasc 5 Mg Tabs (Amlodipine Besylate) .... Take 1 Tablet By Mouth Once A Day 11)  Amaryl 4 Mg Tabs (Glimepiride) .... Take 1 Tablet By Mouth Two Times A Day 12)  Pravastatin Sodium 20 Mg Tabs (Pravastatin Sodium) .... Take 1 Tab By Mouth At Bedtime 13)  Aspirin 81 Mg  Tabs (Aspirin) .... Take 1 Tablet By Mouth Once A Day 14)  Avapro 150 Mg Tabs (Irbesartan) .... Take 1 Tablet By Mouth Once A Day With Hctz 15)  Hydrochlorothiazide 25 Mg Tabs (Hydrochlorothiazide) .... Take 1 Tablet By Mouth Once A Day With Avapro  Allergies (verified): 1)  ! Codeine 2)  ! Sulfa  Past History:  Past Surgical History: Hysterectomy-1980's  Family History: Family History Hypertension-mother and father Family History Diabetes-mother and sisters Family History Hyperlipidemia-mother, sister Family History Asthma-mother  Social History: Marital Status: widow Children: yes, 1 Occupation: retired, Engineering geologist Patient states former smoker - 1/2 PPD since age 61 to 40 yrs Smoking Status:  quit  Review of Systems       The patient complains of shortness of breath with activity, shortness of breath at rest, productive cough, non-productive cough, acid heartburn, indigestion, weight change, difficulty swallowing, nasal congestion/difficulty breathing through nose, sneezing, itching, anxiety, depression, and hand/feet swelling.  The patient denies coughing up blood, chest pain, irregular heartbeats, loss of appetite, abdominal pain, sore throat, tooth/dental problems, headaches, ear ache, joint stiffness or pain, rash, change in color of mucus,  and fever.    Vital Signs:  Patient profile:   65 year old female Height:      65 inches Weight:      234.50 pounds BMI:     39.16 O2 Sat:      98 % on Room air Temp:     98.3 degrees F oral Pulse rate:   66 / minute BP sitting:   118 / 80  (left arm) Cuff size:   large  Vitals Entered By: Zackery Barefoot CMA (Jan 03, 2010  10:52 AM)  O2 Flow:  Room air CC: [name] here for pulmonary consult. [name] c/o chest congestion, thick clear mucus all the time Comments Medications reviewed with patient Verified contact number and pharmacy with patient Zackery Barefoot CMA  Jan 03, 2010 11:05 AM    Physical Exam  Additional Exam:  Gen. Pleasant, well-nourished, in no distress, normal affect ENT - no lesions, no post nasal drip, no thrush Neck: No JVD, no thyromegaly, no carotid bruits Lungs: no use of accessory muscles, no dullness to percussion, clear without rales or rhonchi  Cardiovascular: Rhythm regular, heart sounds  normal, no murmurs or gallops, no peripheral edema Abdomen: soft and non-tender, no hepatosplenomegaly, BS normal. Musculoskeletal: No deformities, no cyanosis or clubbing Neuro:  alert, non focal     Impression & Recommendations:  Problem # 1:  COUGH (ICD-786.2)  Likely a combination of upper airway cough & GERD. No evidence of asthma or significant COPD in this ex smoker with nml CXR. Will use decongestant with antihistaminic tay on PPI - protonix Use mucinex as needed  Stop symbicort & atrovent, use ventolin as needed for bronchospasm If persistnet, limited CT sinuses & intensift GERD treatment.  Orders: Consultation Level III [phone]) Prescription Created Electronically [phone])  Medications Added to Medication List This Visit: 1)  Symbicort 80-4.5 Mcg/act Aero (Budesonide-formoterol fumarate) .... Inhale 2 puffs two times a day 2)  Ventolin Hfa 108 (90 Base) Mcg/act Aers (Albuterol sulfate) .... As needed 3)  Atrovent Hfa 17 Mcg/act Aers (Ipratropium bromide hfa) .... Use two puffs four times a day 4)  Xyzal 5 Mg Tabs (Levocetirizine dihydrochloride) .... Take 1 tablet by mouth once a day 5)  Actos 30 Mg Tabs (Pioglitazone hcl) .... Take 1 tablet by mouth once a day 6)  Norvasc 5 Mg Tabs (Amlodipine besylate) .... Take 1 tablet by mouth once a day 7)  Amaryl 4 Mg Tabs (Glimepiride) ....  Take 1 tablet by mouth two times a day 8)  Pravastatin Sodium 20 Mg Tabs (Pravastatin sodium) .... Take 1 tab by mouth at bedtime 9)  Aspirin 81 Mg Tabs (Aspirin) .... Take 1 tablet by mouth once a day 10)  Avapro 150 Mg Tabs (Irbesartan) .... Take 1 tablet by mouth once a day with hctz 11)  Hydrochlorothiazide 25 Mg Tabs (Hydrochlorothiazide) .... Take 1 tablet by mouth once a day with avapro 12)  Zyrtec-d Allergy & Congestion 5-120 Mg Xr12h-tab (Cetirizine-pseudoephedrine) .... Once daily  Patient Instructions: 1)  Please schedule a follow-up appointment in 1 month with TP 2)  Take zyrtec - D instead of xyzal 3)  Take mucinex 600 two times a day  4)  STOP symbicort & atrovent 5)  Take ventolin MDI 2 puffs as needed for wheezing 6)  Copy sent to: Dr Reche Dixon Prescriptions: ZYRTEC-D ALLERGY & CONGESTION 5-120 MG XR12H-TAB (CETIRIZINE-PSEUDOEPHEDRINE) once daily  #30 x 3   Entered and Authorized by:   Comer Locket Vassie Loll MD  Signed by:   Comer Locket Vassie Loll MD on 01/03/2010   Method used:   Faxed to ...       [facility] - [facility] (retail)       [address] [place], [place]  [address]       Ph: [phone]       Fax: [phone]   RxID:   [id number]    Immunization History:  Influenza Immunization History:    Influenza:  historical (07/05/2009)  Pneumovax Immunization History:    Pneumovax:  historical (07/05/2009)

## 2010-10-04 NOTE — Progress Notes (Signed)
Summary: OTC meds  ---- Converted from flag ---- ---- 01/03/2010 12:12 PM, Comer Locket. Vassie Loll MD wrote: pl let pt know  ---- 01/03/2010 11:58 AM, Zackery Barefoot CMA wrote: Received fax from pharmacy stating "we do not carry this medication-it is over te counter". ------------------------------  Phone Note Outgoing Call   Summary of Call: ATC pt, no answering machine or vmail. Will try again another time. Zackery Barefoot CMA  Jan 03, 2010 3:35 PM   Follow-up for Phone Call        Pt informed. Zackery Barefoot CMA  Jan 04, 2010 2:15 PM

## 2010-10-19 ENCOUNTER — Encounter (INDEPENDENT_AMBULATORY_CARE_PROVIDER_SITE_OTHER): Payer: Self-pay | Admitting: *Deleted

## 2010-11-23 LAB — CREATININE, SERUM: GFR calc non Af Amer: >60 mL/min (ref >60)

## 2010-11-23 LAB — BUN: BUN: 11 mg/dL (ref 6–23)

## 2010-12-08 LAB — GLUCOSE, CAPILLARY: Glucose-Capillary: 68 mg/dL — ABNORMAL LOW (ref 70–99)

## 2011-01-17 NOTE — Group Therapy Note (Signed)
[name], [name] NO.:  [id number]   MEDICAL RECORD NO.:  [id number]          PATIENT TYPE:  WOC   LOCATION:  [facility]                   FACILITY:  [facility]   PHYSICIAN:  Johnella Moloney, MD        DATE OF BIRTH:  February 11, 1946   DATE OF SERVICE:  05/01/2007                                  CLINIC NOTE   CHIEF COMPLAINT:  Ovarian cysts.   HISTORY OF PRESENT ILLNESS:  The patient is a 65 year old, G1, P1, who  was referred from [facility] for incidental findings of bilateral  ovarian cysts.  The patient has been followed by [facility] for  stress urinary incontinence and urge incontinence and was noted to have  a cystocele which does not require any surgical evaluation, as per the  [facility] Carolyn Sylvia who is Dr. Alfredo Martinez.  The patient had  CT scan that was done in June 2008 for vague abdominal pain, and she was  noted to have bilateral ovarian cystic lesions measuring about 28 mm on  the right, 34 mm on the left.  No other characteristics of these cysts  were noted on the CT report.  The patient was then referred here for  followup for these ovarian cysts.  The patient currently reports having  no further pain or any other GYN symptoms.   PAST OB HISTORY:  G1, P1.   PAST GYNECOLOGIC HISTORY:  The patient had a hysterectomy in 1980 for  menorrhagia.  No abnormal Pap smears prior to this.  The patient's last  mammogram was in 2007, and she will be scheduled for a mammogram after  this visit.   PAST MEDICAL HISTORY:  1. Asthma.  2. High cholesterol.  3. Hypothyroidism.  4. Hypertension.   PAST SURGICAL HISTORY:  Hysterectomy.   MEDICATIONS:  Avalide, Sular, and Levothroid.   ALLERGIES:  1. CODEINE.  2. SULFA.  3. BEANS.   SOCIAL HISTORY:  The patient smokes one pack per week and has smoked for  the past 39 years, but she denies any alcohol or illicit drugs.   FAMILY HISTORY:  No GYN cancers.   PHYSICAL EXAMINATION:  Vital signs  stable.  Physical exam deferred.   ASSESSMENT AND PLAN:  The patient is a 65 year old with bilateral  ovarian cysts visualized incidentally on a CT scan.  The patient is here  for followup.  The patient has no family history of ovarian cancer or  breast cancer but is very concerned about these cysts.  Given the  appearance of the cysts on the CT scan where it is described as cystic,  the probability of it being a neoplasia  is low; however, will do a  further evaluation by checking a CA-125 today for the patient and also  checking a followup ultrasound since the last study was done more than 2  months ago.  Will do an ultrasound to further evaluate her ovaries in a  detailed fashion.  If there is anything worrisome about her ovarian  cysts, will recommend laparoscopy and possible oophorectomy at that  point.  Will also follow up the  result of the CA-125.  The patient will  follow up after her ultrasound for further evaluation.           ______________________________  Johnella Moloney, MD     UD/MEDQ  D:  05/01/2007  T:  05/02/2007  Job:  [id number]

## 2011-01-17 NOTE — Group Therapy Note (Signed)
[name], HERON NO.:  [id number]   MEDICAL RECORD NO.:  [id number]          PATIENT TYPE:  WOC   LOCATION:  [facility]                   FACILITY:  [facility]   PHYSICIAN:  Johnella Moloney, MD        DATE OF BIRTH:  [birth year]   DATE OF SERVICE:  01/23/2008                                  CLINIC NOTE   The patient is a 65 year old, status post exploratory laparotomy, lysis  of adhesions, and bilateral salpingo-oophorectomy on December 31, 2007.  The patient is here for surgical follow up.  She reports that she has  mild incisional pain occasionally and also reports that there are a few  areas on her incision that refuse to heal.  She also reports having  vulvar itching and a clear vaginal discharge over the past three to four  days.  She denies any problems with urinary bowel habits or any other  symptoms.   On examination, temperature is 98.1, pulse 97, blood pressure 124/76,  respirations 20, weight 199.8 pounds.  Height 64 inches.  GENERAL:  No apparent distress.  ABDOMINAL EXAM:  Soft, obese, nontender.  On incisional exam, the patient has three areas of superficially exposed  tissue about 1 cm in length on her left side and two areas measuring  about 3 mm in length on her right side.  On all exposed areas, it looks  like the superior edge of the incision did not line up well with the  posterior edge of the incision, and these areas of skin are exposed.  Silver nitrate was used on these exposed areas to induce granulation.  The rest of the incision is clean, dry and intact.  No erythema, no  abnormal drainage or any other problem.  PELVIC EXAM:  Normal external female genitalia.  On speculum exam, a  small amount of clear thin white discharge was noted.  A sample was  taken for wet prep.  No lesions or any other problem.   IMPRESSION:  The patient is a 65 year old who is here for a postop visit  and also has a complaint of vaginitis.  As for her postoperative  concerns with her incision, will continue to monitor.  The patient was  told that this area will heal with time and silver nitrate will induce  granulation which will expedite this process.  As for her vaginitis, we  will follow up with a wet prep.  She was given a prescription for  hydrocortisone 1% cream to apply externally as needed t.i.d., and she  will be called if there are any abnormal results with the wet prep.   The patient was told to return to clinic for her annual exam or for any  other GYN concerns.           ______________________________  Johnella Moloney, MD     UD/MEDQ  D:  01/23/2008  T:  01/23/2008  Job:  [id number]

## 2011-01-17 NOTE — Op Note (Signed)
NAMERENESHIA, [name]                 ACCOUNT NO.:  [id number]   MEDICAL RECORD NO.:  [id number]          PATIENT TYPE:  INP   LOCATION:  9313                          FACILITY:  [facility]   PHYSICIAN:  Norton Blizzard, MD    DATE OF BIRTH:  10/30/1945   DATE OF PROCEDURE:  12/31/2007  DATE OF DISCHARGE:                               OPERATIVE REPORT   PREOPERATIVE DIAGNOSIS:  Persistent complex ovarian cyst.   POSTOPERATIVE DIAGNOSIS:  Persistent complex ovarian cyst.   PROCEDURES:  Operative laparoscopy converted to exploratory laparotomy,  lysis of adhesions, and bilateral salpingo-oophorectomy.   SURGEON:  Norton Blizzard, M.D.   ASSISTANT:  Ginger Carne, M.D.   ANESTHESIA:  General.   IV FLUIDS:  1100 mL of lactated Ringer's.   ESTIMATED BLOOD LOSS:  100 mL.   URINE OUTPUT:  300 mL.   INDICATIONS:  The patient is a 65 year old postmenopausal G1, P1, who  was seen for persistent bilateral ovarian cysts since August 2008.  On  her last ultrasound,  both cysts remained persistent in size (about  3.5cm), but the right cyst was noted to have complex features concerning  for possible neoplasia.  Given the patient's postmenopausal state and  persistent ovarian cysts, she was counseled regarding surgical  management.  The risks of surgery were explained to the patient  including bleeding, infection, injury to surrounding organs, and need  for additional procedures.  A written informed consent was obtained.  Of  note, the patient was told that laparoscopy will be attempted; however,  given her prior history of hysterectomy,  there was a chance of  converting to exploratory laparotomy if there was significant adhesive  disease.   FINDINGS:  Thick omental adhesions to the anterior abdominal wall, also  encompassing the left adnexa.  Normal intestines, normal ureters  bilaterally which were visualized and palpated.  Otherwise no other  pelvic abnormalities.   SPECIMENS:   Bilateral tubes and ovaries.   DISPOSITION:  Pathology.   COMPLICATIONS:  None.   PROCEDURE IN DETAIL:  The patient was given preoperative antibiotics 30  minutes prior to the procedure.  She was then taken to the operating  room where general anesthesia was administered and found to be adequate.  The patient was then placed in a dorsal lithotomy position, and prepped  and draped in a sterile manner.  A Foley catheter was inserted into the  patient's bladder.  Attention was turned to the patient's abdomen where  an infraumbilical incision was made, and a Veress needle was inserted  into the abdomen.  Correct intra-abdominal pressure was confirmed by a  low opening pressure of 0 mmHg.  The abdomen was successfully  insufflated, and a 10-mm trocar was then placed through the umbilical  incision.  The laparoscope was then placed, which also confirmed correct  intra-abdominal placement.  At this point, there was a finding of  multiple thick adhesions of the omentum to the anterior abdominal wall  and also involving the left ovary.  Bilateral 5-mm lower quadrant ports  were placed, and an attempt was made  to lyse the omental adhesions.  However, this attempt was not successful because there were no clear  planes, and there was a no clear indication of the course of the ureter  on the left side.  There was also some bleding encountered that could  not be controlled successfully via the laparoscopic route.  Given these  concerns, the decision was made to convert to laparotomy.  The abdomen  was desufflated and all instruments were removed from the patient's  abdomen .  A scalpel was then used to make a Pfannenstiel incision over  her preexisted incision, and carried through to the underlying layer of  fascia.  The fascia was incised in the midline, and this incision was  extended bilaterally using Mayo scissors.  The superior aspect of the  fascial incision was grasped with Kochers, then the  rectus muscles were  dissected off bluntly and sharply.  A similar process was carried out on  the inferior aspect.  The rectus muscles were separated in the midline  bluntly and the peritoneum was entered sharply.  Upon entry into the  peritoneum, thick omental adhesions were noted.  A clear window was made  through the omentum into the abdomen, and attention was turned to the  patient's left side.  A small amount of bleeding was noted as a s result  of the prior laparascopic dissection; this was controlled using  coagulation.  At this point, the intestines were packed away with wet  moist laparotomy sponges, and a Balfour retractor was placed for good  exposure.  Retroperitoneal dissection was carried out at this point to  identify the left ureter.  This was successfully identified and  visualized to be peristalsing.  At this point, the adhesions that were  covering the left ovary were carefully lysed, and the infundibulopelvic  ligament was clamped and and cut freeing the specimen.  This pedicle was  doubly tied and good hemostasis was noted.  Attention was then turned to  the right side, which was found to be free of adhesions.  The  infundibulopelvic ligament was clamped and and cut freeing the right  fallopian tube and ovary.  The abdomen was then irrigated and hemostasis  was confirmed on the pelvic sidewalls.  A few areas of the omentum were  bleeding, which were controlled using suture ties and coagulation.  Overall, good hemostasis was noted.  All instruments and laparotomy  sponges were removed from the patient's abdomen.  Her rectus muscles  were reapproximated using 0 Vicryl interrupted stitches.  The fascia was  reapproximated using looped 0 PDS, and the skin was closed with staples.  The patient tolerated the procedure well.  Sponge, needle, and  instrument counts were correct x2.  She was taken to the recovery room  awake, extubated, and in stable condition.       Norton Blizzard, MD  Electronically Signed     UAD/MEDQ  D:  12/31/2007  T:  01/01/2008  Job:  [id number]

## 2011-01-17 NOTE — Group Therapy Note (Signed)
NAMEBASILIA, [name] NO.:  [id number]   MEDICAL RECORD NO.:  [id number]          PATIENT TYPE:  WOC   LOCATION:  [facility]                   FACILITY:  [facility]   PHYSICIAN:  Argentina Donovan, MD        DATE OF BIRTH:  1945-12-14   DATE OF SERVICE:  01/08/2008                                  CLINIC NOTE   REASON FOR VISIT:  Suture removal.   HISTORY:  This is a 65 year old G1, P1, who is now 8 days postop  exploratory laparotomy and bilateral salpingo-oophorectomy, lysis of  adhesions which was indicated by persistent complex ovarian cyst.  Her  postoperative course since hospital day 2 when she was discharged, has  been essentially benign.  Her biggest concern is malodor at the incision  site and some drainage onto her clothing.  She is also still having some  discomfort but is managing this with ibuprofen and Percocet, also using  Colace.  No problems with bowel and bladder function.  She has had no  fevers and no other complaints.  Her other medical problems are followed  by physicians at [facility] and are stable.   VITAL SIGNS:  98.8, pulse 85, BP 128/80, respirations 16, weight 194.  GENERAL:  She is in some apparent discomfort and moving from lying to  standing but, otherwise, gait is normal and she is in NAD.  ABDOMEN:  Obese, soft, essentially nontender.  There is some crusty  drainage that has a malodor but there is no purulent drainage.  No  erythema or induration at wound sites.  Staples are removed and there is  some superficial skin separation, particularly with some overriding of  skin edge on patient's left.  This is cleansed with H2O2, dried and 4x4  apron dressing placed.  The 3 laparoscopic sites have four interrupted  sutures which are also removed.  There is no inflammation of those sites  either.  Pathology report reveals left fallopian tube and ovary benign  paratubal cyst, benign ovary, no endometriosis or evidence of  malignancy.  Right  fallopian tube and ovary unremarkable, benign serous  cyst present.  Also no endometriosis or malignancy.   ASSESSMENT:  Doing well postoperative day #8 status post exploratory lap  with bilateral tubo-oophorectomy and lysis of adhesions.   PLAN:  Instructed on keeping wound clean.  She is given some 4x4s also  to keep it dry and will keep her appointment here on May 21.     ______________________________  Caren Griffins, CNM    ______________________________  Argentina Donovan, MD    DP/MEDQ  D:  01/08/2008  T:  01/08/2008  Job:  [id number]

## 2011-01-17 NOTE — Discharge Summary (Signed)
[name], [name]                 ACCOUNT NO.:  [id number]   MEDICAL RECORD NO.:  [id number]          PATIENT TYPE:  INP   LOCATION:  9313                          [facility]   PHYSICIAN:  Norton Blizzard, MD    DATE OF BIRTH:  May 30, 1946   DATE OF ADMISSION:  12/31/2007  DATE OF DISCHARGE:  01/02/2008                               DISCHARGE SUMMARY   ADMISSION DIAGNOSIS:  Persistent complex ovarian cyst in a  postmenopausal woman.   DISCHARGE DIAGNOSIS:  Status post bilateral salpingo-oophorectomy.   PERTINENT LABORATORIES:  The patient's hemoglobin on admission was 14,  and on discharge 12.1.   HOSPITAL COURSE:  The patient is a 65 year old postmenopausal G1, P1,  who had bilateral ovarian cysts since 2008.  The patient had a history  of hysterectomy for menorrhagia several years ago.  On her last  ultrasound, her right cyst was noted to have complex features possibly  concerning for neoplasia.  Given her postmenopausal state and persistent  ovarian cyst, she was counseled regarding her surgical management.  On  December 31, 2007, she underwent an operative laparoscopy, which was  converted to exploratory laparotomy, lysis of adhesions, and bilateral  salpingo-oophorectomy.  For further details of this operation, please  refer to separate dictated operative report.  The patient had an  uncomplicated postoperative course.  By postoperative day #2, she was  ambulating without difficulty, voiding without difficulty, passing  flatus, tolerating regular diet, and her pain was controlled on oral  pain medications.  The patient was deemed stable for discharge to home.   DISCHARGE CONDITION:  Stable.   DISCHARGE MEDICATIONS:  1. Percocet 5/325 mg 1-2 tablets p.o. q.4 h. p.r.n. pain.  2. Ibuprofen 600 mg 1 tablet p.o. q. 6 h. p.r.n. pain.  3. Colace 100 mg 1 tablet p.o. b.i.d. p.r.n. constipation.   DISCHARGE INSTRUCTIONS:  No driving for 2 weeks  or while on narcotic  pain  medications, and no lifting anything greater than 15 pounds for the  next 6 weeks. No sexual activity for 2 weeks.  She may shower or bathe  and may walk up steps.  Patient was told to call or come to [facility] for  fevers, chills, abnormal incisional drainage/erythema, uncontrolled pain  or any other postoperative or gynecologic issues.   FOLLOWUP APPOINTMENTS:  The patient has incisional staples and an  appointment was made in the [facility] Clinic on Jan 08, 2008, at 12:45 p.m. for  staple removal.  She also has another appointment scheduled on Jan 23, 2008, at 1:30 p.m. at the [facility] for her 4-week postoperative check.      Norton Blizzard, MD  Electronically Signed    UAD/MEDQ  D:  01/02/2008  T:  01/03/2008  Job:  [id number]

## 2011-01-17 NOTE — Group Therapy Note (Signed)
[name], [name] NO.:  [id number]   MEDICAL RECORD NO.:  [id number]          PATIENT TYPE:  WOC   LOCATION:  [facility]                   FACILITY:  [facility]   PHYSICIAN:  Johnella Moloney, MD        DATE OF BIRTH:  1946/06/10   DATE OF SERVICE:                                  CLINIC NOTE   CHIEF COMPLAINT:  Ovarian cysts.   HISTORY OF PRESENT ILLNESS:  The patient is a 65 year old G1, P1 who has  been followed in this clinic since August 2008 for incidental findings  of bilateral ovarian cysts. Please refer to my note on May 01, 2007  for further details. The patient had an initial CT scan in June 2008  which showed bilateral ovarian cystic lesions measuring 28 mm on the  right and 34 mm on the left. After she was seen here in August, she had  pelvic ultrasound which showed bilateral simple-appearing ovarian cysts.  The first one was 3.3 x 3 x 2.5 cm on the right, and the left one was  3.2 x 3.1 x 2 cm. These were noted to be unchanged from the CT scan. No  other findings were noted. The patient was seen in followup in  September, and the results were discussed with the patient. Given that  they were simple cysts and a CA-125 which was drawn around the same time  came back as 4.5 she was reassured about the results and was told to  followup for a repeat scan in six months. The patient follows up today  after a repeat ultrasound today which showed persistence of cysts and  interval increase in size. Her right ovary is noted to have a multi-  septated cyst that measures about 3.6 x 2.7 x 3.2 cm, and the left  ovarian cyst measures 3.5 x 2.2 x 2.8 cm and is noted to be simple.  There was no free fluid noted. Given the increase in complexity and  persistence of these lesions, the patient is here to discuss further  management.   There is no interval change in the patient's history.   CURRENT MEDICATIONS:  1. Avalide 150/12.5 mg p.o. q.d.  2. Sular.  3.  Levothroid.  4. Detrol LA  5. Flonase.  6. Lipitor.   ALLERGIES:  CODEINE, SULFA, and BEES.   ASSESSMENT AND PLAN:  The patient is a 65 year old G1, P1 with  persistent bilateral ovarian cysts, and one of the cysts now complex.  The patient was told that complex ovarian cysts in a post-menopausal  woman are concerning for pre-cancerous or neoplastic process; however,  this could not be excluded without the use of pathology and the  persistence of these cysts are concerning. The patient was counseled  regarding laparoscopic bilateral salpingo-oophorectomy which she agrees  to. Will also check a CA-125 to see if there was any change from the  value noted in August. The patient was told that laparoscopic approach  to the bilateral salpingo-oophorectomy would be attempted; however, that  given her prior surgeries which includes a hysterectomy in 1980, that  she might  need conversion to laparotomy and the risks of surgery  including bleeding, infection, injury to surrounding organs, and need  for additional procedures were explained to the patient. Written consent  will be obtained on the day of surgery. The patient will also need a  preop appointment with anesthesia prior to surgery. She does not need  medical clearance at this point as she just finished seeing her primary  care physician, and all of her medications and issues are up-to-date and  being managed effectively. The patient was told to expect a call from a  surgical scheduler regarding the time and date of her surgery.           ______________________________  Johnella Moloney, MD     UD/MEDQ  D:  11/06/2007  T:  11/06/2007  Job:  [id number]

## 2011-03-05 HISTORY — PX: LIPOMA RESECTION: SHX23

## 2011-03-25 ENCOUNTER — Inpatient Hospital Stay (HOSPITAL_COMMUNITY)
Admission: EM | Admit: 2011-03-25 | Discharge: 2011-04-04 | DRG: 392 | Disposition: A | Discharge status: Home / Self Care | Payer: Self-pay | Attending: Family Medicine | Admitting: Family Medicine

## 2011-03-25 ENCOUNTER — Emergency Department (HOSPITAL_COMMUNITY): Payer: Self-pay

## 2011-03-25 ENCOUNTER — Encounter (HOSPITAL_COMMUNITY): Payer: Self-pay

## 2011-03-25 DIAGNOSIS — I10 Essential (primary) hypertension: Secondary | ICD-10-CM | POA: Diagnosis present

## 2011-03-25 DIAGNOSIS — E039 Hypothyroidism, unspecified: Secondary | ICD-10-CM | POA: Diagnosis present

## 2011-03-25 DIAGNOSIS — J45909 Unspecified asthma, uncomplicated: Secondary | ICD-10-CM | POA: Diagnosis present

## 2011-03-25 DIAGNOSIS — E119 Type 2 diabetes mellitus without complications: Secondary | ICD-10-CM

## 2011-03-25 DIAGNOSIS — K5732 Diverticulitis of large intestine without perforation or abscess without bleeding: Principal | ICD-10-CM | POA: Diagnosis present

## 2011-03-25 DIAGNOSIS — K630 Abscess of intestine: Secondary | ICD-10-CM | POA: Diagnosis present

## 2011-03-25 DIAGNOSIS — Z794 Long term (current) use of insulin: Secondary | ICD-10-CM

## 2011-03-25 DIAGNOSIS — K219 Gastro-esophageal reflux disease without esophagitis: Secondary | ICD-10-CM | POA: Diagnosis present

## 2011-03-25 DIAGNOSIS — R112 Nausea with vomiting, unspecified: Secondary | ICD-10-CM | POA: Diagnosis not present

## 2011-03-25 DIAGNOSIS — N898 Other specified noninflammatory disorders of vagina: Secondary | ICD-10-CM | POA: Diagnosis present

## 2011-03-25 DIAGNOSIS — E785 Hyperlipidemia, unspecified: Secondary | ICD-10-CM

## 2011-03-25 DIAGNOSIS — K644 Residual hemorrhoidal skin tags: Secondary | ICD-10-CM | POA: Diagnosis present

## 2011-03-25 DIAGNOSIS — N390 Urinary tract infection, site not specified: Secondary | ICD-10-CM | POA: Diagnosis present

## 2011-03-25 DIAGNOSIS — R109 Unspecified abdominal pain: Secondary | ICD-10-CM

## 2011-03-25 DIAGNOSIS — Z79899 Other long term (current) drug therapy: Secondary | ICD-10-CM

## 2011-03-25 HISTORY — DX: Sickle-cell disease without crisis: D57.1

## 2011-03-25 LAB — URINALYSIS, ROUTINE W REFLEX MICROSCOPIC
Hgb urine dipstick: NEGATIVE
Ketones, ur: 15 mg/dL — AB
Nitrite: POSITIVE — AB
Specific Gravity, Urine: 1.029 (ref 1.005–1.030)
Urobilinogen, UA: 1 mg/dL (ref 0.0–1.0)
pH: 5 (ref 5.0–8.0)

## 2011-03-25 LAB — GLUCOSE, CAPILLARY: Glucose-Capillary: 119 mg/dL — ABNORMAL HIGH (ref 70–99)

## 2011-03-25 LAB — URINE MICROSCOPIC-ADD ON

## 2011-03-25 MED ORDER — IOHEXOL 300 MG/ML  SOLN
100.0000 mL | Freq: Once | INTRAMUSCULAR | Status: AC | PRN
  Administered 2011-03-25: 100 mL via INTRAVENOUS

## 2011-03-26 LAB — GLUCOSE, CAPILLARY
Glucose-Capillary: 237 mg/dL — ABNORMAL HIGH (ref 70–99)
Glucose-Capillary: 164 mg/dL — ABNORMAL HIGH (ref 70–99)

## 2011-03-26 LAB — CBC
HCT: 34.9 % — ABNORMAL LOW (ref 36.0–46.0)
Hemoglobin: 11.6 g/dL — ABNORMAL LOW (ref 12.0–15.0)
MCHC: 33.2 g/dL (ref 30.0–36.0)
MCV: 83.7 fL (ref 78.0–100.0)

## 2011-03-26 LAB — LIPID PANEL
HDL: 28 mg/dL — ABNORMAL LOW (ref >39)
LDL Cholesterol: 91 mg/dL (ref 0–99)
Triglycerides: 119 mg/dL (ref <150)
VLDL: 24 mg/dL (ref 0–40)

## 2011-03-26 LAB — HEMOGLOBIN A1C
Hgb A1c MFr Bld: 11.7 % — ABNORMAL HIGH (ref <5.7)
Mean Plasma Glucose: 289 mg/dL — ABNORMAL HIGH (ref <117)

## 2011-03-27 LAB — WET PREP, GENITAL
Clue Cells Wet Prep HPF POC: NONE SEEN
Trich, Wet Prep: NONE SEEN
WBC, Wet Prep HPF POC: NONE SEEN
Yeast Wet Prep HPF POC: NONE SEEN

## 2011-03-27 LAB — BASIC METABOLIC PANEL
BUN: 4 mg/dL — ABNORMAL LOW (ref 6–23)
Calcium: 8.7 mg/dL (ref 8.4–10.5)
Creatinine, Ser: 0.69 mg/dL (ref 0.50–1.10)
GFR calc non Af Amer: >60 mL/min (ref >60)
Glucose, Bld: 212 mg/dL — ABNORMAL HIGH (ref 70–99)

## 2011-03-27 LAB — CBC
HCT: 35 % — ABNORMAL LOW (ref 36.0–46.0)
Hemoglobin: 12.9 g/dL (ref 12.0–15.0)
Hemoglobin: 11.3 g/dL — ABNORMAL LOW (ref 12.0–15.0)
MCV: 84.3 fL (ref 78.0–100.0)
Platelets: 173 10*3/uL (ref 150–400)
RBC: 4.5 MIL/uL (ref 3.87–5.11)
RBC: 4.15 MIL/uL (ref 3.87–5.11)
WBC: 13.7 10*3/uL — ABNORMAL HIGH (ref 4.0–10.5)
WBC: 9.8 10*3/uL (ref 4.0–10.5)

## 2011-03-27 LAB — DIFFERENTIAL
Basophils Relative: 0 % (ref 0–1)
Eosinophils Absolute: 0.1 10*3/uL (ref 0.0–0.7)
Neutro Abs: 9.2 10*3/uL — ABNORMAL HIGH (ref 1.7–7.7)
Neutrophils Relative %: 67 % (ref 43–77)

## 2011-03-27 LAB — COMPREHENSIVE METABOLIC PANEL
ALT: 37 U/L — ABNORMAL HIGH (ref 0–35)
AST: 27 U/L (ref 0–37)
Albumin: 3.5 g/dL (ref 3.5–5.2)
Calcium: 9.7 mg/dL (ref 8.4–10.5)
Creatinine, Ser: 1.01 mg/dL (ref 0.50–1.10)
GFR calc non Af Amer: 55 mL/min — ABNORMAL LOW (ref >60)
Sodium: 135 mEq/L (ref 135–145)
Total Protein: 7.9 g/dL (ref 6.0–8.3)

## 2011-03-27 LAB — GLUCOSE, CAPILLARY: Glucose-Capillary: 157 mg/dL — ABNORMAL HIGH (ref 70–99)

## 2011-03-27 LAB — URINE CULTURE

## 2011-03-28 DIAGNOSIS — E119 Type 2 diabetes mellitus without complications: Secondary | ICD-10-CM

## 2011-03-28 DIAGNOSIS — K5732 Diverticulitis of large intestine without perforation or abscess without bleeding: Secondary | ICD-10-CM

## 2011-03-28 DIAGNOSIS — I10 Essential (primary) hypertension: Secondary | ICD-10-CM

## 2011-03-28 DIAGNOSIS — E785 Hyperlipidemia, unspecified: Secondary | ICD-10-CM

## 2011-03-28 LAB — BASIC METABOLIC PANEL
BUN: <3 mg/dL — ABNORMAL LOW (ref 6–23)
CO2: 23 mEq/L (ref 19–32)
Calcium: 8.6 mg/dL (ref 8.4–10.5)
Creatinine, Ser: 0.71 mg/dL (ref 0.50–1.10)

## 2011-03-28 LAB — URINE CULTURE: Culture  Setup Time: 201207231609

## 2011-03-28 LAB — CBC
HCT: 35.6 % — ABNORMAL LOW (ref 36.0–46.0)
Hemoglobin: 11.6 g/dL — ABNORMAL LOW (ref 12.0–15.0)
RDW: 13.5 % (ref 11.5–15.5)
WBC: 17.2 10*3/uL — ABNORMAL HIGH (ref 4.0–10.5)

## 2011-03-28 LAB — GLUCOSE, CAPILLARY
Glucose-Capillary: 167 mg/dL — ABNORMAL HIGH (ref 70–99)
Glucose-Capillary: 185 mg/dL — ABNORMAL HIGH (ref 70–99)
Glucose-Capillary: 166 mg/dL — ABNORMAL HIGH (ref 70–99)

## 2011-03-29 ENCOUNTER — Inpatient Hospital Stay (HOSPITAL_COMMUNITY): Payer: Self-pay

## 2011-03-29 LAB — GLUCOSE, CAPILLARY
Glucose-Capillary: 160 mg/dL — ABNORMAL HIGH (ref 70–99)
Glucose-Capillary: 165 mg/dL — ABNORMAL HIGH (ref 70–99)
Glucose-Capillary: 196 mg/dL — ABNORMAL HIGH (ref 70–99)

## 2011-03-29 MED ORDER — IOHEXOL 300 MG/ML  SOLN
90.0000 mL | Freq: Once | INTRAMUSCULAR | Status: AC | PRN
  Administered 2011-03-29: 90 mL via INTRAVENOUS

## 2011-03-30 ENCOUNTER — Inpatient Hospital Stay (HOSPITAL_COMMUNITY): Payer: Self-pay

## 2011-03-30 LAB — BASIC METABOLIC PANEL
CO2: 26 mEq/L (ref 19–32)
Calcium: 8.4 mg/dL (ref 8.4–10.5)
GFR calc non Af Amer: >60 mL/min (ref >60)
Potassium: 3.2 mEq/L — ABNORMAL LOW (ref 3.5–5.1)
Sodium: 136 mEq/L (ref 135–145)

## 2011-03-30 LAB — CBC
MCH: 27.2 pg (ref 26.0–34.0)
MCHC: 32.2 g/dL (ref 30.0–36.0)
Platelets: 233 10*3/uL (ref 150–400)
RBC: 4.26 MIL/uL (ref 3.87–5.11)

## 2011-03-30 LAB — GLUCOSE, CAPILLARY
Glucose-Capillary: 150 mg/dL — ABNORMAL HIGH (ref 70–99)
Glucose-Capillary: 207 mg/dL — ABNORMAL HIGH (ref 70–99)
Glucose-Capillary: 174 mg/dL — ABNORMAL HIGH (ref 70–99)
Glucose-Capillary: 216 mg/dL — ABNORMAL HIGH (ref 70–99)

## 2011-03-31 LAB — COMPREHENSIVE METABOLIC PANEL
ALT: 13 U/L (ref 0–35)
AST: 8 U/L (ref 0–37)
CO2: 22 mEq/L (ref 19–32)
Calcium: 8.4 mg/dL (ref 8.4–10.5)
Chloride: 99 mEq/L (ref 96–112)
GFR calc non Af Amer: >60 mL/min (ref >60)
Potassium: 3.8 mEq/L (ref 3.5–5.1)
Sodium: 132 mEq/L — ABNORMAL LOW (ref 135–145)
Total Bilirubin: 0.3 mg/dL (ref 0.3–1.2)

## 2011-03-31 LAB — CBC
Platelets: 301 10*3/uL (ref 150–400)
RBC: 4.37 MIL/uL (ref 3.87–5.11)
WBC: 17.8 10*3/uL — ABNORMAL HIGH (ref 4.0–10.5)

## 2011-03-31 LAB — GLUCOSE, CAPILLARY
Glucose-Capillary: 168 mg/dL — ABNORMAL HIGH (ref 70–99)
Glucose-Capillary: 220 mg/dL — ABNORMAL HIGH (ref 70–99)
Glucose-Capillary: 265 mg/dL — ABNORMAL HIGH (ref 70–99)

## 2011-03-31 LAB — CULTURE, BLOOD (ROUTINE X 2)

## 2011-04-01 LAB — BASIC METABOLIC PANEL
CO2: 24 mEq/L (ref 19–32)
Calcium: 8.6 mg/dL (ref 8.4–10.5)
Glucose, Bld: 240 mg/dL — ABNORMAL HIGH (ref 70–99)
Potassium: 3.8 mEq/L (ref 3.5–5.1)
Sodium: 134 mEq/L — ABNORMAL LOW (ref 135–145)

## 2011-04-01 LAB — CULTURE, BLOOD (ROUTINE X 2): Culture: NO GROWTH

## 2011-04-01 LAB — GLUCOSE, CAPILLARY
Glucose-Capillary: 203 mg/dL — ABNORMAL HIGH (ref 70–99)
Glucose-Capillary: 217 mg/dL — ABNORMAL HIGH (ref 70–99)

## 2011-04-02 LAB — GLUCOSE, CAPILLARY
Glucose-Capillary: 134 mg/dL — ABNORMAL HIGH (ref 70–99)
Glucose-Capillary: 155 mg/dL — ABNORMAL HIGH (ref 70–99)

## 2011-04-02 LAB — CULTURE, ROUTINE-ABSCESS

## 2011-04-03 ENCOUNTER — Inpatient Hospital Stay (HOSPITAL_COMMUNITY): Payer: Self-pay

## 2011-04-03 LAB — CBC
Hemoglobin: 11.3 g/dL — ABNORMAL LOW (ref 12.0–15.0)
MCH: 27.4 pg (ref 26.0–34.0)
MCHC: 32.2 g/dL (ref 30.0–36.0)

## 2011-04-03 LAB — BASIC METABOLIC PANEL
BUN: 4 mg/dL — ABNORMAL LOW (ref 6–23)
Chloride: 105 mEq/L (ref 96–112)
Creatinine, Ser: 0.71 mg/dL (ref 0.50–1.10)
GFR calc Af Amer: >60 mL/min (ref >60)

## 2011-04-03 LAB — GLUCOSE, CAPILLARY
Glucose-Capillary: 181 mg/dL — ABNORMAL HIGH (ref 70–99)
Glucose-Capillary: 176 mg/dL — ABNORMAL HIGH (ref 70–99)
Glucose-Capillary: 186 mg/dL — ABNORMAL HIGH (ref 70–99)

## 2011-04-03 MED ORDER — IOHEXOL 300 MG/ML  SOLN
80.0000 mL | Freq: Once | INTRAMUSCULAR | Status: AC | PRN
  Administered 2011-04-03: 80 mL via INTRAVENOUS

## 2011-04-04 LAB — CBC
MCHC: 32 g/dL (ref 30.0–36.0)
Platelets: 353 10*3/uL (ref 150–400)
RDW: 13.7 % (ref 11.5–15.5)

## 2011-04-04 LAB — GLUCOSE, CAPILLARY
Glucose-Capillary: 171 mg/dL — ABNORMAL HIGH (ref 70–99)
Glucose-Capillary: 207 mg/dL — ABNORMAL HIGH (ref 70–99)

## 2011-04-04 LAB — ANAEROBIC CULTURE

## 2011-04-06 ENCOUNTER — Ambulatory Visit (INDEPENDENT_AMBULATORY_CARE_PROVIDER_SITE_OTHER): Payer: PRIVATE HEALTH INSURANCE | Admitting: Surgery

## 2011-04-06 ENCOUNTER — Encounter (INDEPENDENT_AMBULATORY_CARE_PROVIDER_SITE_OTHER): Payer: Self-pay | Admitting: Surgery

## 2011-04-06 VITALS — BP 148/94 | HR 80 | Temp 96.4°F | Ht 65.0 in | Wt 201.0 lb

## 2011-04-06 DIAGNOSIS — K5720 Diverticulitis of large intestine with perforation and abscess without bleeding: Secondary | ICD-10-CM

## 2011-04-06 DIAGNOSIS — B373 Candidiasis of vulva and vagina: Secondary | ICD-10-CM

## 2011-04-06 DIAGNOSIS — B370 Candidal stomatitis: Secondary | ICD-10-CM

## 2011-04-06 DIAGNOSIS — K5732 Diverticulitis of large intestine without perforation or abscess without bleeding: Secondary | ICD-10-CM

## 2011-04-06 MED ORDER — FLUCONAZOLE 100 MG PO TABS: 200.0000 mg | ORAL_TABLET | Freq: Every day | ORAL | Status: AC

## 2011-04-06 NOTE — Progress Notes (Signed)
Subjective:     Patient ID: [name], female   DOB: 1946-08-27, 65 y.o.   MRN: [id number]  HPI  Diagnoses: Diverticulitis complicated by abscesses.  Reason for visit: Desire for [place] in removing drain.  Patient is a pleasant obese diabetic who was admitted and [facility] last week  with diverticulitis. She was seen by [place] in consultation Donell Beers, [place], [place]). She had a followup CAT scan which showed an abscess. This was drained 26Jul. She and her followup CAT scan 30Jul (3days ago) which showed another new abscess but it was only 2 cm in size. It was too small to drain. They recommended followup with [place] in a week. She was discharged 3 days ago.  The patient notes that the drain is not putting out much. She wants it out now. No fevers chills or sweats. No nausea vomiting. She is having regular bowel movements. She feels better. She is hesitant to have any surgery.  Review of Systems  Constitutional: Negative for fever, chills, diaphoresis, activity change, appetite change, fatigue and unexpected weight change.  HENT: Negative for ear pain, sore throat, trouble swallowing, neck pain and ear discharge.   Eyes: Negative for photophobia, discharge and visual disturbance.  Respiratory: Negative for cough, choking, chest tightness and shortness of breath.   Cardiovascular: Negative for chest pain and palpitations.  Gastrointestinal: Negative for nausea, vomiting, abdominal pain, diarrhea, constipation, blood in stool, abdominal distention, anal bleeding and rectal pain.  Genitourinary: Negative for dysuria, frequency, decreased urine volume, vaginal bleeding, vaginal discharge and difficulty urinating.  Musculoskeletal: Negative for myalgias, arthralgias and gait problem.  Skin: Negative for color change, pallor and rash.  Neurological: Negative for dizziness, speech difficulty, weakness and numbness.  Hematological: Negative for adenopathy.  Psychiatric/Behavioral: Negative for  confusion and agitation. The patient is not nervous/anxious.        Objective:   Physical Exam  Constitutional: She is oriented to person, place, and time. She appears well-developed and well-nourished. No distress.  HENT:  Head: Normocephalic.  Mouth/Throat: Oropharynx is clear and moist. No oropharyngeal exudate.  Eyes: Conjunctivae and EOM are normal. Pupils are equal, round, and reactive to light. No scleral icterus.  Neck: Normal range of motion. Neck supple. No tracheal deviation present.  Cardiovascular: Normal rate, regular rhythm and intact distal pulses.   Pulmonary/Chest: Effort normal and breath sounds normal. No respiratory distress. She exhibits no tenderness.  Abdominal: Soft. She exhibits no distension and no mass. There is no tenderness. Hernia confirmed negative in the right inguinal area and confirmed negative in the left inguinal area.       Drain in left lower quadrant. Dressing & skin clean. Minimal drainage in bag. No tenderness in the abdomen except at drain site only.  Genitourinary: No vaginal discharge found.  Musculoskeletal: Normal range of motion. She exhibits no tenderness.  Lymphadenopathy:    She has no cervical adenopathy.       Right: No inguinal adenopathy present.       Left: No inguinal adenopathy present.  Neurological: She is alert and oriented to person, place, and time. No cranial nerve deficit. She exhibits normal muscle tone. Coordination normal.  Skin: Skin is warm and dry. No rash noted. She is not diaphoretic. No erythema.  Psychiatric: She has a normal mood and affect. Her behavior is normal. Judgment and thought content normal.       Assessment:     Diverticulitis with abscesses. A one controlled with drain. Other too small to drain.  She seems controlled on antibiotics. Overall improving.    Plan:     I think it is too soon to remove the drain. I think she should keep her appointment next week as initially recommended..  Continue  antibiotics.  Fiber bowel regimen to have her avoid constipation or diarrhea.  I think she would benefit from considering colectomy. This was a pretty severe attack in a diabetic. She does not recall prior attacks though, so I will defer to the surgeons that saw her in the hospital about what they feel. She will need a colonoscopy done to make sure there is no other etiology for her perforation and rule out any polyps or cancers. She's never had a colonoscopy.  We will try to arrange a CAT scan next week prior to her following up with [facility] and see if the drain can be removed. That also make sure that the other abscesses has healed & doesn't need any drain itself. I noted the patient at that if she feels worse or something is not right, then call us sooner. She agreed with this plan.

## 2011-04-06 NOTE — Patient Instructions (Signed)
See Handout on Diverticulitis

## 2011-04-07 NOTE — Discharge Summary (Signed)
[name], RODRIGUES NO.:  [id number]  MEDICAL RECORD NO.:  [id number]  LOCATION:  [facility]                         FACILITY:  [facility]  PHYSICIAN:  Leighton Roach McDiarmid, M.D.DATE OF BIRTH:  1945/09/09  DATE OF ADMISSION:  03/25/2011 DATE OF DISCHARGE:  04/04/2011                              DISCHARGE SUMMARY   PRIMARY CARE PROVIDER:  [facility].  CONSULTS:  [facility].  PROCEDURES:  CT-guided abscess drainage.  A 12-French drain was placed and fluid sample was sent for culture.  REASON FOR ADMISSION:  Diverticular abscess.  DISCHARGE DIAGNOSES:  Primary: 1. Diverticular abscess. 2. Urinary tract infection, resolved. Secondary: 1. Type 2 diabetes. 2. Hypertension. 3. Hyperlipidemia. 4. Gastroesophageal reflux disease. 5. Hypothyroidism.  DISCHARGE MEDICATIONS:  New medicines: 1. Clindamycin 300 mg 1-1/2 tablets four times daily for 10 days. 2. Lantus SoloSTAR 10, 18 units subcutaneously daily. 3. Percocet 5/325, 1 tablet every 4 hours as needed for pain. Continued medications: 1. Aleve 220 mg half to one tablet daily as needed. 2. Amaryl 4 mg b.i.d. 3. Aspirin 81 mg daily. 4. Irbesartan 150 mg daily. 5. Hydrochlorothiazide 25 mg daily. 6. Hydroxyzine 10 mg 1-2 tablets daily for itching or sleep. 7. Levothyroxine 112 mcg daily. 8. Multivitamin daily. 9. Amlodipine 5 mg daily. 10.Pravastatin 20 mg daily. 11.Protonix 40 mg twice daily.  Stop taking the following medications: 1. Lantus SoloSTAR Pen 24 units daily.  HOSPITAL COURSE:  [name] is a 65 year old woman with diabetes, hypertension, hyperlipidemia, and GERD who presented to the ED with a 6- day history of abdominal pain, fevers and chills, and loose stools.  She had a white count of 13.7.  A CT of the abdomen showed diverticulitis with a 2.6 x 5.5-cm diverticular abscess. 1. Diverticulitis and abscess.  Surgery was consulted and recommended     treatment with IV Cipro and  Flagyl as she was afebrile, had only     mild white count, stable vital signs, and the abscess was not     amenable to percutaneous drainage.  They also recommended a repeat     CT scan after 4 days of antibiotics.  The repeat scan showed     interval growth of the abscess which was now amenable to     percutaneous drainage.  The drain was placed by IR on hospital day     #5 and fluid was collected for culture.  The culture grew group S     Streptococcus, sensitivities are pending at this time.  The patient     was switched to IV Rocephin as she has a PENICILLIN allergy.  A     third CT scan was done on hospital day #8 due to continued     abdominal pain.  This CT scan showed resolution of the initial     abscess, but also a new fluid collection that was 2.8 x 2.1 cm.     The patient's the patient symptoms improved and she remained     afebrile with no white count overnight.  Given the small size of     the abscess, it is not felt to be amenable to percutaneous drainage  at this time.  After discussion with Surgery and Interventional     Radiology, it was decided to send [name] home on oral     clindamycin and have her follow up with [facility] Surgery in     1 week for further assessment.  She will likely need a sigmoid     colectomy in the future given her complicated diverticulitis     course. 2. UTI.  This was diagnosed in the ED with the UA positive for     nitrites and leukocytes.  The patient also complained of dysuria     and frequency.  The cultures grew multiple bacteria morphotypes     indicating poor collection.  However, her urinary symptoms improved     with IV Cipro and Flagyl given for the diverticulitis. 3. Type 2 diabetes.  A hemoglobin A1c was 11.7 on this admission.  Her     home Amaryl and Lantus were held initially due to being n.p.o. and     she was started on a sensitive sliding scale.  As her p.o. intake     improved, she was restarted on Lantus 10  units.  Her CBGs remained     mildly elevated in the high 100s to low 200s.  She was discharged     on 18 units with instructions to titrate as needed based on blood     sugars 4. Hypertension.  She was normotensive on admission and her home     medications were held due to being n.p.o.  When her blood pressure     started to increase, she was restarted on her ARB.  On the day of     discharge, her blood pressure was elevated greater than 140 and she     was discharged on her home medication regimen. 5. Hyperlipidemia.  This was stable during this admission.  She was     restarted on her home meds once taking p.o. 6. GERD.  She was started on IV Protonix on admission.  Once     tolerating p.o., she was switched to her home regimen. 7. Hypothyroidism.  As she was initially n.p.o., her home dose was     converted to IV.  Once she was tolerating p.o., she was switched     back to her home p.o. dose.  DISCHARGE INSTRUCTIONS:  Discussed with the patient the antibiotics she will be taking.  Also spoke with her about importance of followup with the [facility] Surgery.  She is instructed to call [facility] Surgery or return to the ED if she develops worsening abdominal pain, fevers, nausea, vomiting or bloody stools.  Also discussed that we are cutting back her home Lantus to 18 units daily.  She was instructed to titrate back up based on her blood sugars.  Surgery will switch the drain to gravity bag and provided teaching on the care of the drain.  CONDITION ON DISCHARGE:  Stable.  PENDING TESTS:  Sensitivities for the group S strep.  DISPOSITION:  To home.  FOLLOWUP: 1. With [facility] Surgery in 1 week.  The patient is to call     for the appointment. 2. With [facility] 1-2 weeks after her Surgery appointment.  Again,     she is to call for this appointment.  FOLLOWUP ISSUES: 1. Follow up on Surgery recommendations regarding further treatment of     recurrent  diverticular abscesses. 2. May need to titrate her home Lantus as it was  decreased during this     hospitalization. 3. Assess for development of watery diarrhea as she was put on oral     clindamycin which has been known to cause C. diff colitis.    ______________________________ Despina Hick, MD   ______________________________ Leighton Roach McDiarmid, M.D.    EB/MEDQ  D:  04/04/2011  T:  04/04/2011  Job:  [id number]  cc:   [facility]  Electronically Signed by Despina Hick MD on 04/05/2011 09:55:45 AM Electronically Signed by Acquanetta Belling M.D. on 04/07/2011 02:38:16 PM

## 2011-04-10 ENCOUNTER — Ambulatory Visit (HOSPITAL_COMMUNITY)
Admission: RE | Admit: 2011-04-10 | Discharge: 2011-04-10 | Disposition: A | Discharge status: Home / Self Care | Payer: Self-pay | Source: Ambulatory Visit | Attending: Surgery | Admitting: Surgery

## 2011-04-10 DIAGNOSIS — K651 Peritoneal abscess: Secondary | ICD-10-CM | POA: Insufficient documentation

## 2011-04-10 DIAGNOSIS — K5720 Diverticulitis of large intestine with perforation and abscess without bleeding: Secondary | ICD-10-CM

## 2011-04-10 MED ORDER — IOHEXOL 300 MG/ML  SOLN: 100.0000 mL | Freq: Once | INTRAMUSCULAR | Status: AC | PRN

## 2011-04-10 NOTE — Consult Note (Signed)
NAMEJENAVIVE, LAMBOY NO.:  [id number]  MEDICAL RECORD NO.:  [id number]  LOCATION:  [facility]                         FACILITY:  [facility]  PHYSICIAN:  Almond Lint, MD       DATE OF BIRTH:  [birth year]  DATE OF CONSULTATION:  03/25/2011 DATE OF DISCHARGE:                                CONSULTATION   PRIMARY CARE PHYSICIAN:  Santiago Bumpers. Hensel, MD  CHIEF COMPLAINT:  Abdominal pain with diverticulitis.  HISTORY OF PRESENT ILLNESS:  [name] is a 65 year old female with a week of crampy diffuse abdominal pain.  It is now localized in the lower abdomen.  She has had fevers and shaking chills and sweats this week. She denies nausea, vomiting or diarrhea.  Around 2 weeks ago, she had severe constipation, took some laxatives, and she said after than the things started to go downhill.  She has not had any sick contacts.  Two days ago, the pain was 10/10, but now it is only around 8/10.  She has not ever had any symptoms like this before and has never been diagnosed with diverticulitis.  She has had a decreased appetite.  She also complains of bloating.  PAST MEDICAL HISTORY:  Significant for hypertension, asthma, goiter, diabetes, and seasonal allergies.  PAST SURGICAL HISTORY:  Hysterectomy in the 1980s, oophorectomy in September 2009, and a thyroidectomy.  FAMILY HISTORY:  Negative for diverticulitis.  SOCIAL HISTORY:  She is a former smoker and former drinker, but does not use drugs.  REVIEW OF SYSTEMS:  Positive for a vaginal discharge, otherwise negative x11 other than in the HPI.  MEDICATIONS: 1. Amaryl. 2. Avapro. 3. Hydrochlorothiazide. 4. Hydroxyzine. 5. Lantus. 6. Levothyroxine. 7. Norvasc. 8. Pravastatin. 9. Protonix. 10.Aleve. 11.Aspirin.  ALLERGIES: 1. CODEINE. 2. SULFA. 3. BEES.  PHYSICAL EXAMINATION:  VITAL SIGNS:  Temperature 98.5, heart rate 92, blood pressure 132/87, respiratory rate 18, sats are 98% on room air. HEENT:   Normocephalic, atraumatic. NECK:  Supple. No without adenopathy. Trachea is midline. HEART:  Regular rate and rhythm.  No murmurs, rubs, or gallops. LUNGS:  Clear to auscultation bilaterally. PULSES:  Normal. ABDOMEN:  Soft, distended.  Hypoactive bowel sounds.  Tender in the lower abdomen.  Worse in the left lower quadrant. MUSCULOSKELETAL:  Shows no gross deformities. SKIN:  No rashes are seen. NEURO:  No gross motor sensory deficits. PSYCH:  Mood, affect, and judgment appear.  LABORATORY DATA:  Sodium 135, potassium 3.2, chloride 95, CO2 of 27, BUN 13, creatinine 1.0, glucose 224.  Bilirubin 0.8, AST 27, ALT 37, alk phos 124.  White count 13.7, hemoglobin 12.9, hematocrit 37.5, and platelet count is 173,000.  CT scan shows sigmoid diverticulitis with small abscess around 2.5 cm in the sigmoid mesentery.  IMPRESSION:  [name] is a 65 year old female with sigmoid diverticular abscess.  PLAN:  IV fluids, IV antibiotics.  I would start Cipro and Flagyl now. I will leave her n.p.o. other than perhaps small amount of ice-chips.  I do not think that this abscess is amenable to percutaneous drainage, but I will discuss this with Radiology.  At this point if she does turn around quickly from the standpoint  of her symptoms, she may be able to escape acute surgical intervention.  However, if she is not amenable to percutaneous drainage and her symptoms do not improve, then she would need surgery this admission.     Almond Lint, MD     FB/MEDQ  D:  03/25/2011  T:  03/26/2011  Job:  [id number]  cc:   William A. Leveda Anna, M.D.  Electronically Signed by Almond Lint MD on 04/10/2011 01:46:34 PM

## 2011-04-12 ENCOUNTER — Ambulatory Visit (INDEPENDENT_AMBULATORY_CARE_PROVIDER_SITE_OTHER): Payer: PRIVATE HEALTH INSURANCE | Admitting: Surgery

## 2011-04-12 ENCOUNTER — Encounter (INDEPENDENT_AMBULATORY_CARE_PROVIDER_SITE_OTHER): Payer: Self-pay | Admitting: Surgery

## 2011-04-12 VITALS — BP 124/88 | HR 60 | Temp 96.8°F

## 2011-04-12 DIAGNOSIS — K5732 Diverticulitis of large intestine without perforation or abscess without bleeding: Secondary | ICD-10-CM

## 2011-04-12 DIAGNOSIS — J019 Acute sinusitis, unspecified: Secondary | ICD-10-CM | POA: Insufficient documentation

## 2011-04-12 DIAGNOSIS — K5720 Diverticulitis of large intestine with perforation and abscess without bleeding: Secondary | ICD-10-CM

## 2011-04-12 MED ORDER — AZITHROMYCIN 250 MG PO TABS: ORAL_TABLET | ORAL | Status: DC

## 2011-04-12 NOTE — Progress Notes (Signed)
Subjective:     Patient ID: [name], female   DOB: [birth year], 65 y.o.   MRN: [id number]  HPI   Diagnoses: Diverticulitis complicated by abscesses.  Reason for visit: Followup.  Patient is a pleasant obese diabetic who was admitted and [facility] late July with diverticulitis. She was seen by [place] in consultation (Drs. Byerly, Thoompson, Cornett). She had a followup CAT scan which showed an abscess. This was drained 26Jul. She and her followup CAT scan 30Jul (3days ago) which showed another new abscess but it was only 2 cm in size. It was too small to drain.  Followup CT scan done this week. Abscess is gone. Drain was removed in radiology.  No fevers chills or sweats. No nausea vomiting. She is having regular bowel movements. She feels better.She has never had a colonoscopy. She is more interested in having surgery. She found a letter from [facility]. It noted that any surgeries to be covered until January 2003. That was for consideration of removing the ovary. She wonders if that applies to [place].  Review of Systems  Constitutional: Negative for fever, chills, diaphoresis, activity change, appetite change, fatigue and unexpected weight change.  HENT: Negative for ear pain, sore throat, trouble swallowing, neck pain and ear discharge.   Eyes: Negative for photophobia, discharge and visual disturbance.  Respiratory: Negative for cough, choking, chest tightness and shortness of breath.   Cardiovascular: Negative for chest pain and palpitations.  Gastrointestinal: Negative for nausea, vomiting, abdominal pain, diarrhea, constipation, blood in stool, abdominal distention, anal bleeding and rectal pain.  Genitourinary: Negative for dysuria, frequency, decreased urine volume, vaginal bleeding, vaginal discharge and difficulty urinating.  Musculoskeletal: Negative for myalgias, arthralgias and gait problem.  Skin: Negative for color change, pallor and rash.  Neurological: Negative for  dizziness, speech difficulty, weakness and numbness.  Hematological: Negative for adenopathy.  Psychiatric/Behavioral: Negative for confusion and agitation. The patient is not nervous/anxious.        Objective:   Physical Exam  Constitutional: She is oriented to person, place, and time. She appears well-developed and well-nourished. No distress.  HENT:  Head: Normocephalic.  Mouth/Throat: Oropharynx is clear and moist. No oropharyngeal exudate.  Eyes: Conjunctivae and EOM are normal. Pupils are equal, round, and reactive to light. No scleral icterus.  Neck: Normal range of motion. Neck supple. No tracheal deviation present.  Cardiovascular: Normal rate, regular rhythm and intact distal pulses.   Pulmonary/Chest: Effort normal and breath sounds normal. No respiratory distress. She exhibits no tenderness.  Abdominal: Soft. She exhibits no distension and no mass. There is no tenderness. Hernia confirmed negative in the right inguinal area and confirmed negative in the left inguinal area.       Drain out. Dressing & skin clean. No tenderness in the abdomen.  Genitourinary: No vaginal discharge found.  Musculoskeletal: Normal range of motion. She exhibits no tenderness.  Lymphadenopathy:    She has no cervical adenopathy.       Right: No inguinal adenopathy present.       Left: No inguinal adenopathy present.  Neurological: She is alert and oriented to person, place, and time. No cranial nerve deficit. She exhibits normal muscle tone. Coordination normal.  Skin: Skin is warm and dry. No rash noted. She is not diaphoretic. No erythema.  Psychiatric: She has a normal mood and affect. Her behavior is normal. Judgment and thought content normal.       Assessment:     Diverticulitis with abscesses x2. (One controlled  with drain. Other too small to drain)- now resolved.  Overall improving.    Plan:     Complete antibiotics (Clindamycin)  Fiber bowel regimen to have her avoid constipation  or diarrhea.  Consider colectomy. This was a pretty severe attack in a diabetic. She agrees as was a bad attack and she never wants to have anything like this again. She is more interested in pursuing surgery this time around.  She does not recall prior attacks though, so I will defer to the surgeons that saw her in the hospital about what they feel.   At the least, she will need a colonoscopy done to make sure there is no other etiology for her perforation and rule out any polyps or cancers. She's never had a colonoscopy.    I have sent her to see our financial counselor to see what options are available for her should they decide to recommend surgery.

## 2011-05-09 NOTE — H&P (Signed)
[name], [name] NO.:  [id number]  MEDICAL RECORD NO.:  [id number]  LOCATION:  [facility]                         FACILITY:  [facility]  PHYSICIAN:  Santiago Bumpers. Anberlin Diez, M.D.DATE OF BIRTH:  [birth year]  DATE OF ADMISSION:  03/25/2011 DATE OF DISCHARGE:                             HISTORY & PHYSICAL   PRIMARY CARE PROVIDER:  [facility].  CHIEF COMPLAINT:  Abdominal pain.  HISTORY OF PRESENT ILLNESS:  The patient's problems with abdominal pain began approximately 6 days ago and were accompanied by fevers and chills.  She also began to notice some abdominal cramping at that time. Since that time she has been having loose bowel movements without any notable blood in them.  She does report some intermittent problems with reddish colored urine which are new for her.  She has never had any problems like this in the past.  She has had some vaginal discharge for the past 2 days but no bleeding, itching, or discomfort in the perineum. Her abdominal pain has been accompanied by some nausea but no vomiting. The patient has been eating.  As previously noted, the patient has been passing somewhat loose stools.  Her pain is crampy in nature, and intermittent.  It is primarily in the left lower quadrant but does occasionally move up into the left upper quadrant.  The patient also admits to some problems with cramps elsewhere in her body, most particularly in her feet.  She has been slightly lightheaded on and off during last week but denies any shortness of breath, dyspnea on exertion, or chest pain.  She has not taken anything for her symptoms and cannot point to any particular inciting or alleviating factors.  The patient has had a bilateral oophorectomy approximately a year ago at [facility] and a hysterectomy approximately 30 years ago at [facility].  She has also had surgery for bladder prolapse.  This was performed at [facility] last year.  REVIEW OF SYSTEMS:   Performed and is per HPI.  Otherwise, 12 point review of systems was unremarkable.  PAST MEDICAL HISTORY:  Notable for: 1. Diabetes. 2. Hypertension. 3. Hypothyroidism. 4. Hyperlipidemia. 5. Gastroesophageal reflux disease.  FAMILY HISTORY:  Positive for coronary artery disease in first-degree relatives, otherwise unremarkable.  SOCIAL HISTORY:  Social alcohol use, no tobacco, no illicit substances. Is a widow with 1 sister living in the [place] area and 1 living child.  ALLERGIES:  CODEINE, SULFA, and PENICILLIN.  Note that penicillin allergy is anaphylaxis.  MEDICATIONS: 1. Levothyroxine 112 mcg p.o. daily. 2. Amaryl 5 mg p.o. b.i.d. 3. Protonix 40 mg p.o. b.i.d. 4. Pravastatin 20 mg p.o. daily. 5. Norvasc 5 mg p.o. daily. 6. Hydrochlorothiazide 25 mg p.o. daily. 7. Irbesartan 150 mg p.o. daily. 8. Hydroxyzine 10 mg p.o. at bedtime p.r.n. 9. Lantus 9 units subcu daily.  PHYSICAL EXAMINATION:  VITAL SIGNS:  Pulse 92, blood pressure 132/87, respirations are 18, satting 97% on room air, temperature is 98.0. GENERAL:  No acute distress, cooperative with exam. HEENT:  Mucous membranes moist, extraocular movements intact, pupils equal, round, reactive to light. CARDIOVASCULAR:  Regular rate and rhythm with no murmurs, rubs or gallops. RESPIRATORY:  Clear  to auscultation bilaterally. ABDOMEN:  Soft, moderate tenderness in left lower quadrant without any notable organomegaly.  Exam is somewhat limited secondary to body habitus but there is no notable guarding or rigidity.  No evidence of peritoneal signs. EXTREMITIES:  No edema. NEURO:  Grossly intact.  LABORATORIES AND IMAGING:  CBC is notable for white count of 13.7, hemoglobin of 12.9, creatinine of 1.01, and a blood glucose of 224.  ALT is ever since slightly elevated at 37 but AST and amylase are both normal.  Urinalysis shows a specific gravity of 1.029, 100 glucose, small bili, 15 ketones, 100 protein, positive  nitrite, small leukocytes. Wet prep is unremarkable.  CT of the pelvis shows sigmoid diverticulitis with a 2.6 x 5.5 cm diverticular abscess.  ASSESSMENT AND PLAN:  This is a 65 year old female presenting with a diverticular abscess and abdominal pain.. 1. Diverticulitis:  The patient is afebrile with only a mild white     count and stable vital signs.  CT scan does show a diverticular     abscess.  Surgery has been made aware and will see the patient     later today.  For now, we will make the patient n.p.o. except for     medications and will start the patient on intravenous Cipro and     p.o. Flagyl (as started in the emergency department).  We will     await Surgery's recommendations for the management of this patient.     We will also give the patient intravenous morphine as a p.r.n. for     significant abdominal pain.  Urine culture and blood cultures are     pending.  We will give the patient a 1 liter normal saline bolus     and then run D5 half-normal at approximately maintenance. 2. Diabetes:  The patient reports that her blood sugars have been     relatively poorly controlled over the last several weeks.  I am not     certain whether this is related to infection or inadequate     treatment for diabetes.  For now, we will place her on a sensitive     sliding scale and monitor CBGs closely.  We will also obtain an A1c     to assess glycemic control. 3. Hypertension:  We will monitor the patient's blood pressure closely     and will likely restart the patient's home medications in the     morning if okay with Surgery.  For now we will give p.r.n.     hydralazine for systolics greater than 180. 4. Hypothyroid:  We will obtain a TSH and continue the patient's home     Synthroid.  She is to remain n.p.o., this will be given     intravenous.  The patient has received her dose for today. 5. Hyperlipidemia:  We will obtain a fasting lipid panel in the     morning, it is not clear  when the last one of these was obtained     and the patient has only intermittently seen her primary care     provider.  We will continue the patient's home medications once she     is taking oral medications. 6. Gastroesophageal reflux disease:  We will give the patient Protonix     for this condition. 7. FENGI:  N.p.o. for now, advance for surgery, D5 half-normal at 125     mL an hour. 8. Prophylaxis:  SCDs for now, switch to subcu heparin  for surgery.     Protonix as per home dose. 9. Code status:  Full code. 10.Disposition:  Pending improvement.    ______________________________ Majel Homer, MD   ______________________________ Santiago Bumpers. Leveda Anna, M.D.    ER/MEDQ  D:  03/25/2011  T:  03/26/2011  Job:  [id number]  Electronically Signed by Manuela Neptune MD on 05/04/2011 11:56:55 AM Electronically Signed by Doralee Albino M.D. on 05/09/2011 07:32:20 AM

## 2011-05-10 ENCOUNTER — Encounter (INDEPENDENT_AMBULATORY_CARE_PROVIDER_SITE_OTHER): Payer: Self-pay | Admitting: General Surgery

## 2011-05-10 ENCOUNTER — Ambulatory Visit (INDEPENDENT_AMBULATORY_CARE_PROVIDER_SITE_OTHER): Payer: PRIVATE HEALTH INSURANCE | Admitting: General Surgery

## 2011-05-10 VITALS — BP 114/78 | HR 78

## 2011-05-10 DIAGNOSIS — K5732 Diverticulitis of large intestine without perforation or abscess without bleeding: Secondary | ICD-10-CM

## 2011-05-10 NOTE — Progress Notes (Signed)
Subjective:     Patient ID: [name], female   DOB: 29-Dec-1945, 65 y.o.   MRN: [id number]  HPI The patient presented status post hospitalization for sigmoid diverticulitis with perforation. This was treated by intravenous antibiotic and percutaneous drain. Followup CT scan was done demonstrating resolution of inflammation and abscess. 13 his drain was removed in interventional radiology. The patient is doing well. She is eating and moving her bowels without difficulty. She has no abdominal pain.  Review of Systems     Objective:   Physical Exam  Constitutional: She appears well-developed and well-nourished.  HENT:  Head: Normocephalic.  Eyes: Pupils are equal, round, and reactive to light.  Neck: Neck supple.  Cardiovascular: Normal rate and regular rhythm.   Pulmonary/Chest: Effort normal. No stridor. She has no wheezes. She has no rales.  Abdominal: Soft. She exhibits no distension and no mass. There is no tenderness. There is no rebound and no guarding.  Skin: She is not diaphoretic.       Assessment:     Doing  very well status post successful treatment of diverticulitis with perforation.   Plan:     I do not feel immediate operative intervention is required at this time. The patient does have a colonoscopy. She does not remember if she has never had one. She is a patient of health service. She claims she has a charity account at [facility]. I will discuss with my partners who trained at [facility] regarding a good gastroenterologist and make a referral. I will see her back in 8 weeks. She is aware to call us in the interim if she has any abdominal pain. Questions were answered.

## 2011-05-16 ENCOUNTER — Other Ambulatory Visit (HOSPITAL_COMMUNITY): Payer: Self-pay | Admitting: Family Medicine

## 2011-05-16 DIAGNOSIS — Z1231 Encounter for screening mammogram for malignant neoplasm of breast: Secondary | ICD-10-CM

## 2011-05-30 LAB — CBC
HCT: 35.7 — ABNORMAL LOW
HCT: 41.6
Platelets: 193
Platelets: 202
RBC: 4.12
RDW: 13.3
WBC: 16 — ABNORMAL HIGH

## 2011-05-30 LAB — BASIC METABOLIC PANEL
BUN: 6
GFR calc Af Amer: >60
GFR calc non Af Amer: >60
Potassium: 3.4 — ABNORMAL LOW

## 2011-06-01 LAB — POCT I-STAT, CHEM 8
BUN: 19
BUN: 11
Calcium, Ion: 1.16
Chloride: 104
Creatinine, Ser: 0.9
Potassium: 4
Sodium: 135
Sodium: 137

## 2011-06-26 ENCOUNTER — Ambulatory Visit (HOSPITAL_COMMUNITY)
Admission: RE | Admit: 2011-06-26 | Discharge: 2011-06-26 | Disposition: A | Discharge status: Home / Self Care | Payer: Self-pay | Source: Ambulatory Visit | Attending: Family Medicine | Admitting: Family Medicine

## 2011-06-26 DIAGNOSIS — Z1231 Encounter for screening mammogram for malignant neoplasm of breast: Secondary | ICD-10-CM | POA: Insufficient documentation

## 2011-07-05 ENCOUNTER — Encounter (INDEPENDENT_AMBULATORY_CARE_PROVIDER_SITE_OTHER): Payer: Self-pay | Admitting: General Surgery

## 2011-07-12 ENCOUNTER — Encounter (INDEPENDENT_AMBULATORY_CARE_PROVIDER_SITE_OTHER): Payer: Self-pay | Admitting: General Surgery

## 2011-07-12 ENCOUNTER — Ambulatory Visit (INDEPENDENT_AMBULATORY_CARE_PROVIDER_SITE_OTHER): Payer: PRIVATE HEALTH INSURANCE | Admitting: General Surgery

## 2011-07-12 VITALS — BP 130/88 | HR 72 | Temp 96.5°F | Resp 24 | Ht 65.0 in | Wt 211.5 lb

## 2011-07-12 DIAGNOSIS — K5732 Diverticulitis of large intestine without perforation or abscess without bleeding: Secondary | ICD-10-CM

## 2011-07-12 NOTE — Progress Notes (Signed)
Subjective:     Patient ID: [name], female   DOB: 05-27-1946, 65 y.o.   MRN: [id number]  HPI Patient presents for followup of diverticulitis. She was hospitalized previously. Her abdominal pain is completely resolved. She has had no fevers. She is moving her bowels regularly. She is having no abdominal complaints.  Review of Systems     Objective:   Physical Exam  Cardiovascular: Normal rate, regular rhythm and normal heart sounds.   No murmur heard. Pulmonary/Chest: Effort normal and breath sounds normal. No respiratory distress. She has no wheezes. She has no rales.  Abdominal: Soft. Bowel sounds are normal. She exhibits no distension and no mass. There is no tenderness. There is no rebound and no guarding.       Assessment:     Acute diverticulitis resolved    Plan:     I again recommend that the patient see a gastroenterologist for colonoscopy. She is involved with a charity program at [facility]. She has agreed to contact them for that evaluation. Otherwise we will be happy to see her she has any further abdominal complaints. At this time surgery will not be necessary. We will revisit that if she has recurrence.

## 2011-09-05 DIAGNOSIS — I639 Cerebral infarction, unspecified: Secondary | ICD-10-CM

## 2011-09-05 HISTORY — DX: Cerebral infarction, unspecified: I63.9

## 2011-12-24 IMAGING — CR DG CHEST 2V
2 series · 2 of 2 positions shown · non-contrast
Comparison: 04/15/2009

CLINICAL DATA: Cough, congestion, shortness of breath.

CHEST - 2 VIEW

[w chest pa]
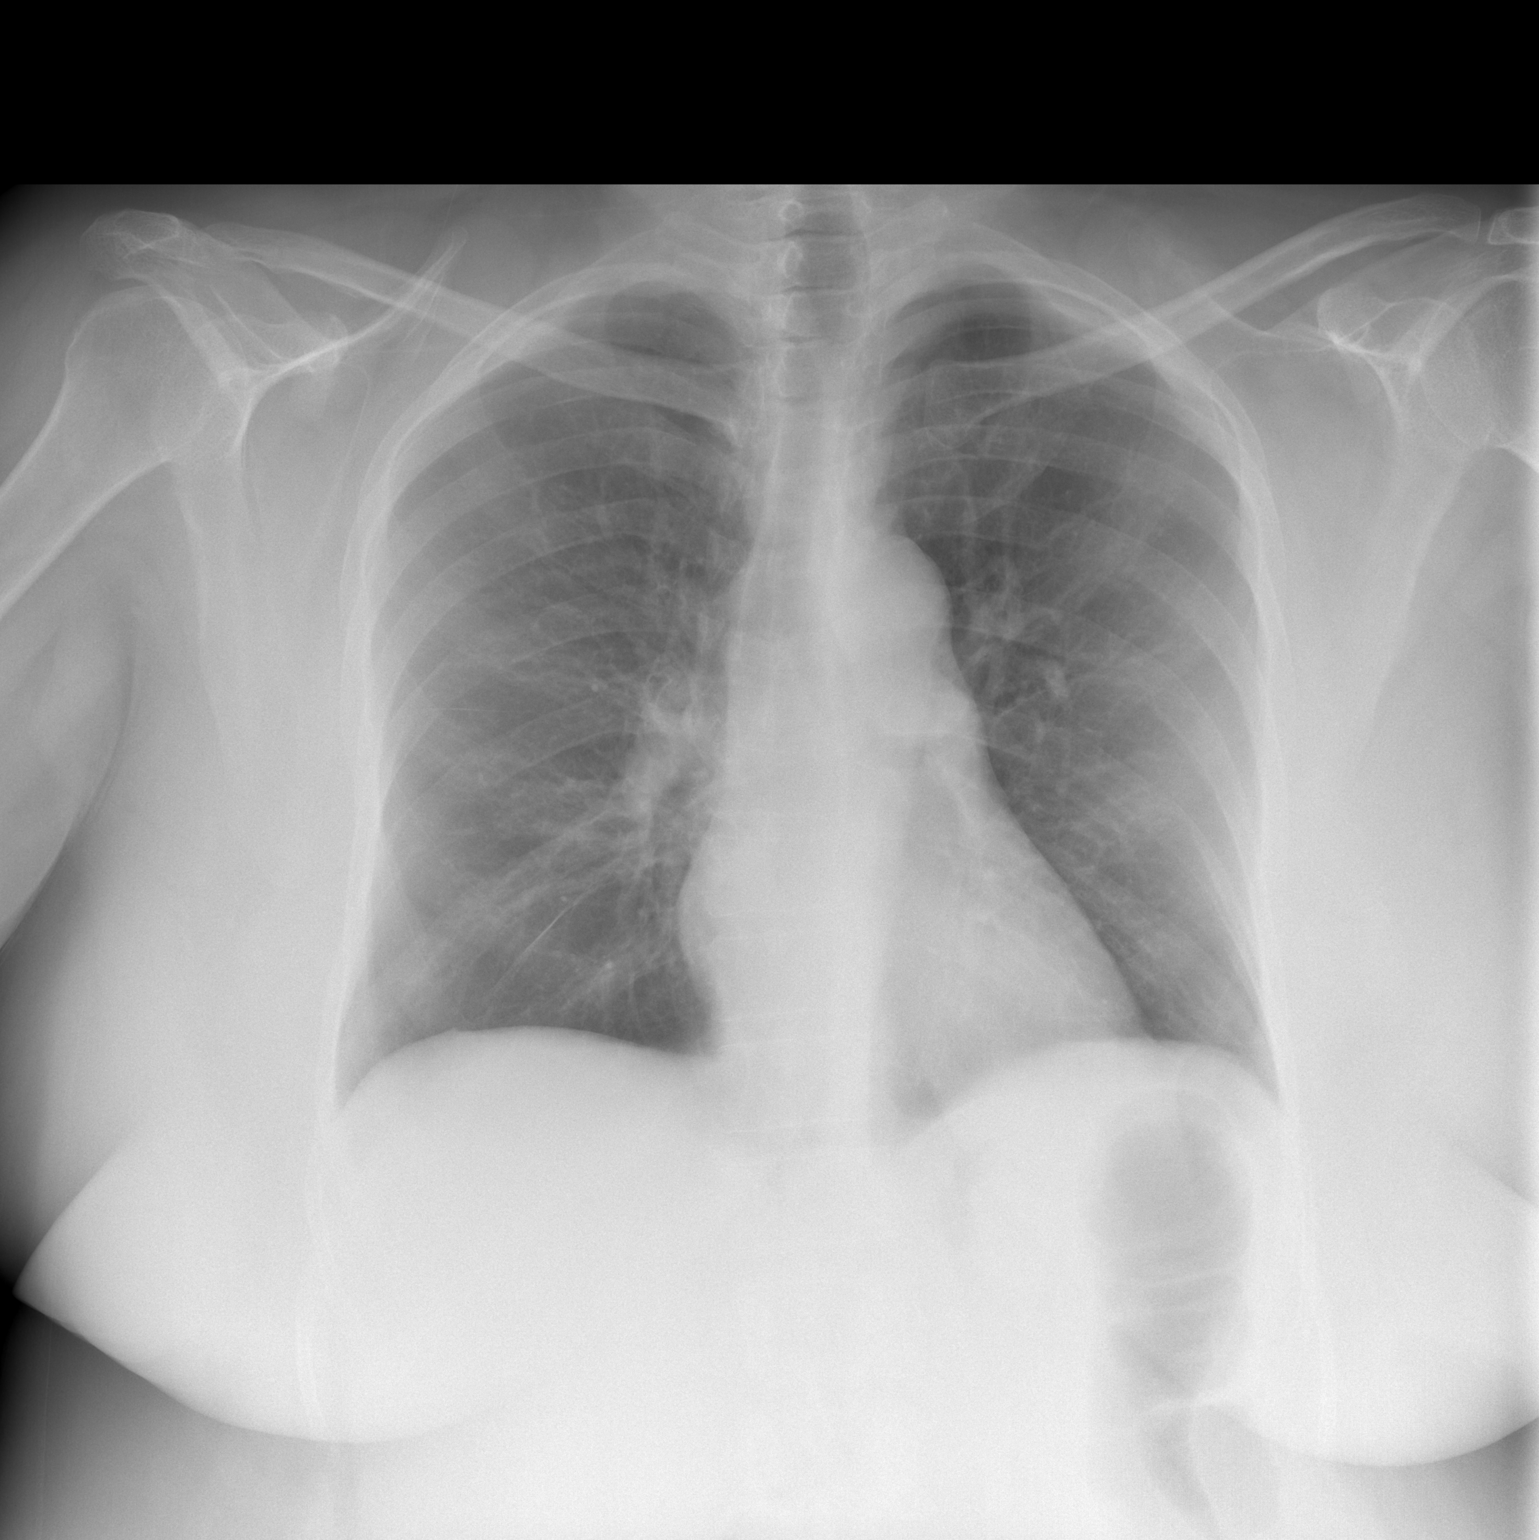

[w chest lat]
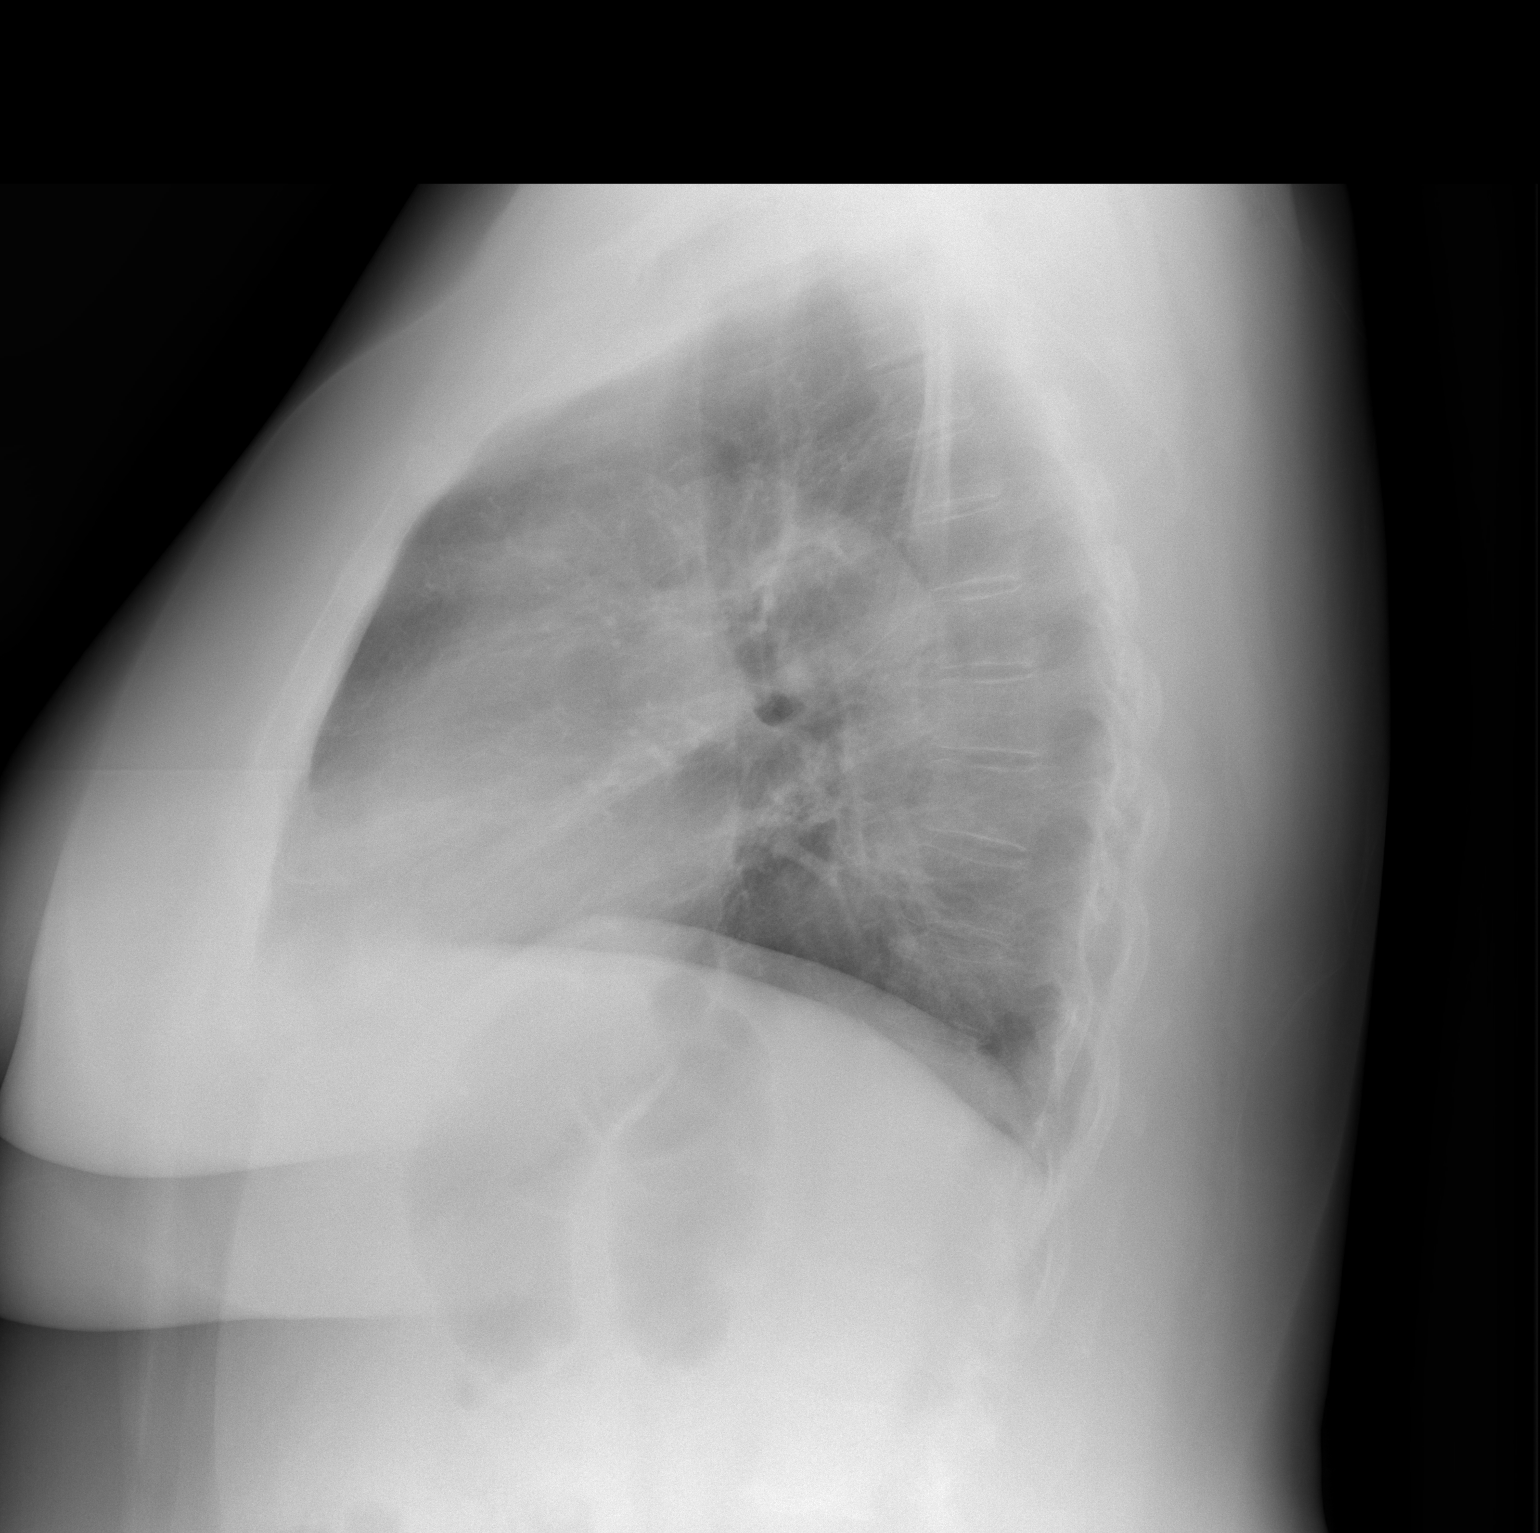

[2 of 2 positions shown; findings below may reference images not displayed]

FINDINGS: Heart and mediastinal contours are within normal limits.
No focal opacities or effusions.  No acute bony abnormality.
IMPRESSION: No active disease.

## 2012-02-09 ENCOUNTER — Encounter: Payer: Self-pay | Admitting: Gastroenterology

## 2012-03-09 ENCOUNTER — Observation Stay (HOSPITAL_COMMUNITY): Payer: Self-pay

## 2012-03-09 ENCOUNTER — Emergency Department (HOSPITAL_COMMUNITY): Payer: Self-pay

## 2012-03-09 ENCOUNTER — Inpatient Hospital Stay (HOSPITAL_COMMUNITY)
Admission: EM | Admit: 2012-03-09 | Discharge: 2012-03-11 | DRG: 065 | Disposition: A | Discharge status: Home / Self Care | Payer: Self-pay | Attending: Internal Medicine | Admitting: Internal Medicine

## 2012-03-09 ENCOUNTER — Encounter (HOSPITAL_COMMUNITY): Payer: Self-pay | Admitting: Emergency Medicine

## 2012-03-09 DIAGNOSIS — I10 Essential (primary) hypertension: Secondary | ICD-10-CM | POA: Diagnosis present

## 2012-03-09 DIAGNOSIS — D571 Sickle-cell disease without crisis: Secondary | ICD-10-CM | POA: Diagnosis present

## 2012-03-09 DIAGNOSIS — R059 Cough, unspecified: Secondary | ICD-10-CM

## 2012-03-09 DIAGNOSIS — J45909 Unspecified asthma, uncomplicated: Secondary | ICD-10-CM | POA: Diagnosis present

## 2012-03-09 DIAGNOSIS — J309 Allergic rhinitis, unspecified: Secondary | ICD-10-CM

## 2012-03-09 DIAGNOSIS — R05 Cough: Secondary | ICD-10-CM

## 2012-03-09 DIAGNOSIS — K5720 Diverticulitis of large intestine with perforation and abscess without bleeding: Secondary | ICD-10-CM

## 2012-03-09 DIAGNOSIS — I639 Cerebral infarction, unspecified: Secondary | ICD-10-CM | POA: Diagnosis present

## 2012-03-09 DIAGNOSIS — E039 Hypothyroidism, unspecified: Secondary | ICD-10-CM

## 2012-03-09 DIAGNOSIS — Z79899 Other long term (current) drug therapy: Secondary | ICD-10-CM

## 2012-03-09 DIAGNOSIS — IMO0001 Reserved for inherently not codable concepts without codable children: Secondary | ICD-10-CM | POA: Diagnosis present

## 2012-03-09 DIAGNOSIS — E785 Hyperlipidemia, unspecified: Secondary | ICD-10-CM | POA: Diagnosis present

## 2012-03-09 DIAGNOSIS — I6350 Cerebral infarction due to unspecified occlusion or stenosis of unspecified cerebral artery: Principal | ICD-10-CM

## 2012-03-09 DIAGNOSIS — G8190 Hemiplegia, unspecified affecting unspecified side: Secondary | ICD-10-CM | POA: Diagnosis present

## 2012-03-09 DIAGNOSIS — R200 Anesthesia of skin: Secondary | ICD-10-CM

## 2012-03-09 DIAGNOSIS — K219 Gastro-esophageal reflux disease without esophagitis: Secondary | ICD-10-CM | POA: Diagnosis present

## 2012-03-09 DIAGNOSIS — N390 Urinary tract infection, site not specified: Secondary | ICD-10-CM

## 2012-03-09 DIAGNOSIS — Z7982 Long term (current) use of aspirin: Secondary | ICD-10-CM

## 2012-03-09 DIAGNOSIS — E119 Type 2 diabetes mellitus without complications: Secondary | ICD-10-CM | POA: Diagnosis present

## 2012-03-09 DIAGNOSIS — R51 Headache: Secondary | ICD-10-CM

## 2012-03-09 LAB — BASIC METABOLIC PANEL
BUN: 11 mg/dL (ref 6–23)
Creatinine, Ser: 0.7 mg/dL (ref 0.50–1.10)
GFR calc Af Amer: >90 mL/min (ref >90)
GFR calc non Af Amer: 90 mL/min — ABNORMAL LOW (ref >90)
Glucose, Bld: 142 mg/dL — ABNORMAL HIGH (ref 70–99)

## 2012-03-09 LAB — CBC WITH DIFFERENTIAL/PLATELET
Basophils Relative: 1 % (ref 0–1)
Eosinophils Absolute: 0.2 10*3/uL (ref 0.0–0.7)
HCT: 43.1 % (ref 36.0–46.0)
Hemoglobin: 14.2 g/dL (ref 12.0–15.0)
Lymphs Abs: 3.9 10*3/uL (ref 0.7–4.0)
MCH: 28.5 pg (ref 26.0–34.0)
MCHC: 32.9 g/dL (ref 30.0–36.0)
MCV: 86.5 fL (ref 78.0–100.0)
Monocytes Absolute: 0.3 10*3/uL (ref 0.1–1.0)
Monocytes Relative: 4 % (ref 3–12)

## 2012-03-09 LAB — CBC
MCH: 29.3 pg (ref 26.0–34.0)
MCHC: 34 g/dL (ref 30.0–36.0)
MCV: 86.3 fL (ref 78.0–100.0)
Platelets: 236 10*3/uL (ref 150–400)
RDW: 13.1 % (ref 11.5–15.5)

## 2012-03-09 LAB — CSF CELL COUNT WITH DIFFERENTIAL
RBC Count, CSF: 3 /mm3 — ABNORMAL HIGH
WBC, CSF: 1 /mm3 (ref 0–5)

## 2012-03-09 LAB — URINALYSIS, ROUTINE W REFLEX MICROSCOPIC
Bilirubin Urine: NEGATIVE
Hgb urine dipstick: NEGATIVE
Ketones, ur: NEGATIVE mg/dL
Nitrite: NEGATIVE
Urobilinogen, UA: 0.2 mg/dL (ref 0.0–1.0)
pH: 5 (ref 5.0–8.0)

## 2012-03-09 LAB — URINE MICROSCOPIC-ADD ON

## 2012-03-09 MED ORDER — ENOXAPARIN SODIUM 40 MG/0.4ML ~~LOC~~ SOLN
40.0000 mg | SUBCUTANEOUS | Status: DC
  Administered 2012-03-09 – 2012-03-10 (×2): 40 mg via SUBCUTANEOUS
  Filled 2012-03-09 (×3): qty 0.4

## 2012-03-09 MED ORDER — PANTOPRAZOLE SODIUM 40 MG PO TBEC
40.0000 mg | DELAYED_RELEASE_TABLET | Freq: Every day | ORAL | Status: DC
  Administered 2012-03-10 – 2012-03-11 (×2): 40 mg via ORAL
  Filled 2012-03-09 (×2): qty 1

## 2012-03-09 MED ORDER — SODIUM CHLORIDE 0.9 % IV SOLN: INTRAVENOUS | Status: AC

## 2012-03-09 MED ORDER — OMEGA-3 FATTY ACIDS 1000 MG PO CAPS
2.0000 g | ORAL_CAPSULE | Freq: Every day | ORAL | Status: DC
Start: 2012-03-09 — End: 2012-03-09

## 2012-03-09 MED ORDER — AMLODIPINE BESYLATE 5 MG PO TABS
5.0000 mg | ORAL_TABLET | Freq: Every day | ORAL | Status: DC
  Administered 2012-03-10 – 2012-03-11 (×2): 5 mg via ORAL
  Filled 2012-03-09 (×2): qty 1

## 2012-03-09 MED ORDER — ONDANSETRON HCL 4 MG/2ML IJ SOLN: 4.0000 mg | Freq: Four times a day (QID) | INTRAMUSCULAR | Status: DC | PRN

## 2012-03-09 MED ORDER — DIPHENHYDRAMINE HCL 50 MG/ML IJ SOLN
25.0000 mg | Freq: Once | INTRAMUSCULAR | Status: AC
  Administered 2012-03-09: 25 mg via INTRAVENOUS
  Filled 2012-03-09: qty 1

## 2012-03-09 MED ORDER — ASPIRIN 325 MG PO TABS
325.0000 mg | ORAL_TABLET | Freq: Every day | ORAL | Status: DC
  Filled 2012-03-09 (×2): qty 1

## 2012-03-09 MED ORDER — ACETAMINOPHEN 325 MG PO TABS
650.0000 mg | ORAL_TABLET | ORAL | Status: DC | PRN
  Administered 2012-03-10 – 2012-03-11 (×2): 650 mg via ORAL
  Filled 2012-03-09 (×2): qty 2

## 2012-03-09 MED ORDER — INSULIN GLARGINE 100 UNIT/ML ~~LOC~~ SOLN: 42.0000 [IU] | Freq: Every day | SUBCUTANEOUS | Status: DC

## 2012-03-09 MED ORDER — ASPIRIN 300 MG RE SUPP
300.0000 mg | Freq: Every day | RECTAL | Status: DC
  Filled 2012-03-09 (×2): qty 1

## 2012-03-09 MED ORDER — INSULIN ASPART 100 UNIT/ML ~~LOC~~ SOLN
0.0000 [IU] | Freq: Every day | SUBCUTANEOUS | Status: DC
  Administered 2012-03-09: 5 [IU] via SUBCUTANEOUS
  Administered 2012-03-10: 2 [IU] via SUBCUTANEOUS

## 2012-03-09 MED ORDER — SODIUM CHLORIDE 0.9 % IV BOLUS (SEPSIS)
1000.0000 mL | Freq: Once | INTRAVENOUS | Status: AC
  Administered 2012-03-09: 1000 mL via INTRAVENOUS

## 2012-03-09 MED ORDER — METOCLOPRAMIDE HCL 5 MG/ML IJ SOLN
10.0000 mg | Freq: Once | INTRAMUSCULAR | Status: AC
  Administered 2012-03-09: 10 mg via INTRAVENOUS
  Filled 2012-03-09: qty 2

## 2012-03-09 MED ORDER — OMEGA-3-ACID ETHYL ESTERS 1 G PO CAPS
1.0000 g | ORAL_CAPSULE | Freq: Every day | ORAL | Status: DC
  Administered 2012-03-10 – 2012-03-11 (×2): 1 g via ORAL
  Filled 2012-03-09 (×2): qty 1

## 2012-03-09 MED ORDER — SENNOSIDES-DOCUSATE SODIUM 8.6-50 MG PO TABS
1.0000 | ORAL_TABLET | Freq: Every evening | ORAL | Status: DC | PRN
  Filled 2012-03-09: qty 1

## 2012-03-09 MED ORDER — INSULIN ASPART 100 UNIT/ML ~~LOC~~ SOLN
0.0000 [IU] | Freq: Three times a day (TID) | SUBCUTANEOUS | Status: DC
  Administered 2012-03-10 – 2012-03-11 (×4): 2 [IU] via SUBCUTANEOUS

## 2012-03-09 MED ORDER — DEXTROSE 5 % IV SOLN
1.0000 g | INTRAVENOUS | Status: DC
  Administered 2012-03-10: 1 g via INTRAVENOUS
  Filled 2012-03-09 (×3): qty 10

## 2012-03-09 MED ORDER — GADOBENATE DIMEGLUMINE 529 MG/ML IV SOLN
20.0000 mL | Freq: Once | INTRAVENOUS | Status: AC | PRN
  Administered 2012-03-09: 20 mL via INTRAVENOUS

## 2012-03-09 MED ORDER — LEVOTHYROXINE SODIUM 112 MCG PO TABS
112.0000 ug | ORAL_TABLET | Freq: Every day | ORAL | Status: DC
  Administered 2012-03-10 – 2012-03-11 (×2): 112 ug via ORAL
  Filled 2012-03-09 (×3): qty 1

## 2012-03-09 MED ORDER — ASPIRIN 81 MG PO CHEW
324.0000 mg | CHEWABLE_TABLET | Freq: Once | ORAL | Status: AC
  Administered 2012-03-09: 324 mg via ORAL
  Filled 2012-03-09: qty 4

## 2012-03-09 MED ORDER — SODIUM CHLORIDE 0.9 % IV SOLN
INTRAVENOUS | Status: DC
  Administered 2012-03-10: 04:00:00 via INTRAVENOUS

## 2012-03-09 MED ORDER — DEXTROSE 5 % IV SOLN
1.0000 g | Freq: Once | INTRAVENOUS | Status: AC
  Administered 2012-03-09: 1 g via INTRAVENOUS
  Filled 2012-03-09: qty 10

## 2012-03-09 MED ORDER — IRBESARTAN 150 MG PO TABS
150.0000 mg | ORAL_TABLET | Freq: Every day | ORAL | Status: DC
  Administered 2012-03-10 – 2012-03-11 (×2): 150 mg via ORAL
  Filled 2012-03-09 (×2): qty 1

## 2012-03-09 MED ORDER — DEXAMETHASONE SODIUM PHOSPHATE 4 MG/ML IJ SOLN
10.0000 mg | Freq: Once | INTRAMUSCULAR | Status: AC
  Administered 2012-03-09: 10 mg via INTRAVENOUS
  Filled 2012-03-09: qty 1

## 2012-03-09 MED ORDER — ACETAMINOPHEN 650 MG RE SUPP: 650.0000 mg | RECTAL | Status: DC | PRN

## 2012-03-09 NOTE — ED Notes (Signed)
MD at bedside. 

## 2012-03-09 NOTE — ED Provider Notes (Signed)
History     CSN: [id number]  Arrival date & time 03/09/12  0945   First MD Initiated Contact with Patient 03/09/12 1059      Chief Complaint  Patient presents with  . Numbness  . Headache    (Consider location/radiation/quality/duration/timing/severity/associated sxs/prior treatment) HPI  66yoF pw multiple complaints. States she woke up this morning 0700 with tingling and numbness to left lips/face and LUE-fingers. Has progressively worsening since that time. C/O diffuse > left weakness as well. The weakness has persisted. She feels like "I'm off balance". She denies paresthesias LLE. C/O right sided headache which was present this morning as well. C/o 8/10 headache at this time. Denies N/V/photo/phonophobia. Does not have h/o headaches in past. Denies chest pain. Has intermittent chest palpitations at night which are waking her up. No history of similar.  Denies abdominal pain. Was here recently for diverticulitis.  ED Notes, ED Provider Notes from 03/09/12 0000 to 03/09/12 10:05:21       Levonne Hubert, RN 03/09/2012 09:58      Pt c/o of tingling and numbness on left side of body. Pt reports when she woke up around 0700 today her fingers were tingling and her whole left side of her body progressively started to feel numb and tingling. Pt reports the left side of her mouth feels numb. Pt is A&O X 4. Pt has equal bilateral hand grips. No facial droop. No slurred speech. Pt in chair fanning herself.      Past Medical History  Diagnosis Date  . Diabetes mellitus   . Sickle cell anemia   . Hyperlipidemia   . Thyroid disease   . Hypertension   . Ear infection   . Sinus problem     Past Surgical History  Procedure Date  . Lipoma resection July 2012    8cm, 4cm Left neck, right upper back  . Abdominal hysterectomy     Family History  Problem Relation Age of Onset  . Heart disease Mother   . Hypertension Mother   . Diabetes Mother   . Heart disease Father   . Hypertension  Father   . Diabetes Father   . Heart disease Sister   . Hypertension Sister   . Diabetes Sister   . Hypertension Brother   . Cancer Brother   . Diabetes Sister   . Hypertension Sister   . Diabetes Sister   . Hypertension Sister     History  Substance Use Topics  . Smoking status: Former Smoker    Quit date: 07/11/2005  . Smokeless tobacco: Never Used  . Alcohol Use: Yes     occasional    OB History    Grav Para Term Preterm Abortions TAB SAB Ect Mult Living                  Review of Systems  All other systems reviewed and are negative.  except as noted HPI   Allergies  Sulfonamide derivatives and Codeine  Home Medications   Current Outpatient Rx  Name Route Sig Dispense Refill  . AMLODIPINE BESYLATE 5 MG PO TABS Oral Take 5 mg by mouth daily.      . ASPIRIN 81 MG PO TABS Oral Take 81 mg by mouth daily.      . OMEGA-3 FATTY ACIDS 1000 MG PO CAPS Oral Take 2 g by mouth daily.    Marland Kitchen GLIMEPIRIDE 4 MG PO TABS Oral Take 4 mg by mouth 2 (two) times daily.      Marland Kitchen  HYDROCHLOROTHIAZIDE 25 MG PO TABS Oral Take 25 mg by mouth daily.      . INSULIN GLARGINE 100 UNIT/ML [place] SOLN Subcutaneous Inject 42 Units into the skin daily.     . IRBESARTAN 150 MG PO TABS Oral Take 150 mg by mouth daily.     Marland Kitchen LEVOTHYROXINE SODIUM 112 MCG PO TABS Oral Take 112 mcg by mouth daily.      Marland Kitchen LORATADINE 10 MG PO CAPS Oral Take 10 mg by mouth daily as needed. For allergies    . OMEPRAZOLE 20 MG PO CPDR Oral Take 20 mg by mouth daily.      BP 126/70  Pulse 80  Temp 98.3 F (36.8 C) (Oral)  Resp 18  SpO2 94%  Physical Exam  Nursing note and vitals reviewed. Constitutional: She is oriented to person, place, and time. She appears well-developed.  HENT:  Head: Atraumatic.  Mouth/Throat: Oropharynx is clear and moist.  Eyes: Conjunctivae and EOM are normal. Pupils are equal, round, and reactive to light.  Neck: Normal range of motion. Neck supple.  Cardiovascular: Normal rate, regular  rhythm, normal heart sounds and intact distal pulses.   Pulmonary/Chest: Effort normal and breath sounds normal. No respiratory distress. She has no wheezes. She has no rales.  Abdominal: Soft. She exhibits no distension. There is no tenderness. There is no rebound and no guarding.  Musculoskeletal: Normal range of motion.  Neurological: She is alert and oriented to person, place, and time. No cranial nerve deficit. She exhibits normal muscle tone. Coordination normal.       Strength 5/5 all extremities No pronator drift No facial droop  Dec gross sensation V1-3 Dec gross sensation all fingers x thumb   Skin: Skin is warm and dry. No rash noted.  Psychiatric: She has a normal mood and affect.    Date: 03/09/2012  Rate: 71  Rhythm: normal sinus rhythm  QRS Axis: normal  Intervals: normal  ST/T Wave abnormalities: nonspecific t wave abnl  Conduction Disutrbances:none  Narrative Interpretation:   Old EKG Reviewed: no significant change from previous  ED Course  Procedures (including critical care time)  Labs Reviewed  GLUCOSE, CAPILLARY - Abnormal; Notable for the following:    Glucose-Capillary 170 (*)     All other components within normal limits  BASIC METABOLIC PANEL - Abnormal; Notable for the following:    Glucose, Bld 142 (*)     GFR calc non Af Amer 90 (*)     All other components within normal limits  URINALYSIS, ROUTINE W REFLEX MICROSCOPIC - Abnormal; Notable for the following:    Leukocytes, UA SMALL (*)     All other components within normal limits  URINE MICROSCOPIC-ADD ON - Abnormal; Notable for the following:    Squamous Epithelial / LPF FEW (*)     Bacteria, UA MANY (*)     All other components within normal limits  CSF CELL COUNT WITH DIFFERENTIAL - Abnormal; Notable for the following:    RBC Count, CSF 6 (*)     All other components within normal limits  CSF CELL COUNT WITH DIFFERENTIAL - Abnormal; Notable for the following:    RBC Count, CSF 3 (*)      All other components within normal limits  CBC WITH DIFFERENTIAL   Ct Head Wo Contrast  03/09/2012  *RADIOLOGY REPORT*  Clinical Data: Left sided numbness and tingling  CT HEAD WITHOUT CONTRAST  Technique:  Contiguous axial images were obtained from the base of the  skull through the vertex without contrast.  Comparison: None  Findings: The brain has a normal appearance without evidence for hemorrhage, infarction, hydrocephalus, or mass lesion.  There is no extra axial fluid collection.  The skull and paranasal sinuses are normal.  IMPRESSION: No acute intracranial abnormalities.  Original Report Authenticated By: Rosealee Albee, M.D.   1. Headache   2. Numbness   3. Stroke    MDM  Right sided "worst headache" of her life upon awakening with Left facial paresthesias and Left finger numbness/paresthesias upon awakening. Does not have a history of migraines nor does it sound typical for migraine although it could be a complicated migraine. Is no rash by exam. Her symptoms themselves with the tingling in only for her fingers +face would be atypical for acute stroke. Feeling better with IVF, Reglan, benadryl, decadron. Her neuro symptoms are not resolving with this. CT head unremarkable. CSF not c/w SAH.    MRI tech has called over from Radiology with +diffuse MRI for stroke. Discussed case with Dr. Roseanne Reno of neurology. Plan to admit [facility] to [facility]       Forbes Cellar, MD 03/09/12 [phone]

## 2012-03-09 NOTE — H&P (Signed)
History and Physical       Hospital Admission Note Date: 03/09/2012  Patient name: [name] Medical record number: [id number] Date of birth: 17-Aug-1946 Age: 66 y.o. Gender: female  PCP: Dr. Andrey Campanile at [facility]  Chief Complaint:  Woke up today morning with numbness involving left face and fingers  HPI: Patient is a 66 year old female with history of diabetes, hyperlipidemia, hypertension, hypothyroidism presented to ED with sudden onset of numbness involving left face and left hand this morning. History was obtained from the patient who states that she started having headache last night but no other neurological symptoms. This morning she woke up with numbness involving her left lips which progressed to left side of the face and left fingers. Patient denies any prior history of stroke or TIA. She takes aspirin 81 mg daily. Her headache is significantly improved in the ED. Lumbar puncture was done in the ED which showed no indication of acute infection or subarachnoid hemorrhage. MRI was done which showed 7 mm acute infarction involving the right thalamus. She also felt somewhat off balance however reports no falls or syncope.  Review of Systems:  Constitutional: Denies fever, chills, diaphoresis, appetite change and fatigue.  HEENT: Denies photophobia, eye pain, redness, hearing loss, ear pain, congestion, sore throat, rhinorrhea, sneezing, mouth sores, trouble swallowing, neck pain, neck stiffness and tinnitus.   patient had significant headache since last night which has somewhat improved in the ED. She denied any blurry vision.  Respiratory: Denies SOB, DOE, cough, chest tightness,  and wheezing.   Cardiovascular: Denies chest pain, palpitations and leg swelling.  Gastrointestinal: Denies nausea, vomiting, abdominal pain, diarrhea, constipation, blood in stool and abdominal distention.  Genitourinary: Patient is also complains of having  dysuria and foul odor Musculoskeletal: Denies myalgias, back pain, joint swelling, arthralgias and gait problem.  Skin: Denies pallor, rash and wound.  Neurological: Please see history of present illness Hematological: Denies adenopathy. Easy bruising, personal or family bleeding history  Psychiatric/Behavioral: Denies suicidal ideation, mood changes, confusion, nervousness, sleep disturbance and agitation  Past Medical History: Past Medical History  Diagnosis Date  . Diabetes mellitus   . Sickle cell anemia   . Hyperlipidemia   . Thyroid disease   . Hypertension   . Ear infection   . Sinus problem    Past Surgical History  Procedure Date  . Lipoma resection July 2012    8cm, 4cm Left neck, right upper back  . Abdominal hysterectomy     Medications: Prior to Admission medications   Medication Sig Start Date End Date Taking? Authorizing Provider  amLODipine (NORVASC) 5 MG tablet Take 5 mg by mouth daily.     Yes Historical Provider, MD  aspirin 81 MG tablet Take 81 mg by mouth daily.     Yes Historical Provider, MD  fish oil-omega-3 fatty acids 1000 MG capsule Take 2 g by mouth daily.   Yes Historical Provider, MD  glimepiride (AMARYL) 4 MG tablet Take 4 mg by mouth 2 (two) times daily.     Yes Historical Provider, MD  hydrochlorothiazide 25 MG tablet Take 25 mg by mouth daily.     Yes Historical Provider, MD  insulin glargine (LANTUS) 100 UNIT/ML injection Inject 42 Units into the skin daily.    Yes Historical Provider, MD  irbesartan (AVAPRO) 150 MG tablet Take 150 mg by mouth daily.    Yes Historical Provider, MD  levothyroxine (SYNTHROID, LEVOTHROID) 112 MCG tablet Take 112 mcg by mouth daily.  Yes Historical Provider, MD  Loratadine 10 MG CAPS Take 10 mg by mouth daily as needed. For allergies   Yes Historical Provider, MD  omeprazole (PRILOSEC) 20 MG capsule Take 20 mg by mouth daily.   Yes Historical Provider, MD    Allergies:   Allergies  Allergen Reactions  .  Sulfonamide Derivatives Shortness Of Breath  . Codeine Itching    Social History:  reports that she quit smoking about 6 years ago. She has never used smokeless tobacco. She reports that she drinks alcohol. She reports that she does not use illicit drugs.  Family History: Family History  Problem Relation Age of Onset  . Heart disease Mother   . Hypertension Mother   . Diabetes Mother   . Heart disease Father   . Hypertension Father   . Diabetes Father   . Heart disease Sister   . Hypertension Sister   . Diabetes Sister   . Hypertension Brother   . Cancer Brother   . Diabetes Sister   . Hypertension Sister   . Diabetes Sister   . Hypertension Sister     Physical Exam: Blood pressure 172/98, pulse 98, temperature 98.6 F (37 C), temperature source Oral, resp. rate 20, SpO2 96.00%. General: Alert, awake, oriented x3, in no acute distress., No dysarthria present HEENT: normocephalic, atraumatic, anicteric sclera, pink conjunctiva, pupils equal and reactive to light and accomodation, oropharynx clear Neck: supple, no masses or lymphadenopathy, no goiter, no bruits  Heart: Regular rate and rhythm, without murmurs, rubs or gallops. Lungs: Clear to auscultation bilaterally, no wheezing, rales or rhonchi. Abdomen: Soft, nontender, nondistended, positive bowel sounds, no masses. Extremities: No clubbing, cyanosis or edema with positive pedal pulses. Neuro: Grossly intact, no focal neurological deficits, strength 5/5 upper and lower extremities bilaterally Psych: alert and oriented x 3, normal mood and affect Skin: no rashes or lesions, warm and dry   LABS on Admission:  Basic Metabolic Panel:  Lab 03/09/12 2956  NA 137  K 3.6  CL 100  CO2 26  GLUCOSE 142*  BUN 11  CREATININE 0.70  CALCIUM 9.6  MG --  PHOS --   CBC:  Lab 03/09/12 1155  WBC 8.6  NEUTROABS 4.2  HGB 14.2  HCT 43.1  MCV 86.5  PLT 199   CBG:  Lab 03/09/12 1002  GLUCAP 170*     Radiological  Exams on Admission: Ct Head Wo Contrast  03/09/2012  *RADIOLOGY REPORT*  Clinical Data: Left sided numbness and tingling  CT HEAD WITHOUT CONTRAST  Technique:  Contiguous axial images were obtained from the base of the skull through the vertex without contrast.  Comparison: None  Findings: The brain has a normal appearance without evidence for hemorrhage, infarction, hydrocephalus, or mass lesion.  There is no extra axial fluid collection.  The skull and paranasal sinuses are normal.  IMPRESSION: No acute intracranial abnormalities.  Original Report Authenticated By: Rosealee Albee, M.D.   Mr [facility] Wo Contrast  03/09/2012  *RADIOLOGY REPORT*  Clinical Data:  Left facial numbness.  Headache.  MRI HEAD WITHOUT AND WITH CONTRAST MRA HEAD WITHOUT CONTRAST  Technique:  Multiplanar, multiecho pulse sequences of the brain and surrounding structures were obtained without and with intravenous contrast.  Angiographic images of the head were obtained using MRA technique without contrast.  Contrast: 20mL MULTIHANCE GADOBENATE DIMEGLUMINE 529 MG/ML IV SOLN  Comparison:  Head CT same day  MRI HEAD WITHOUT AND WITH CONTRAST  Findings:  There is a 7 mm  acute infarction within the right thalamus.  No other acute infarction.  Elsewhere, there are minimal old small vessel changes within the hemispheric white matter.  No cortical or large vessel territory infarction.  No mass lesion, hemorrhage, hydrocephalus or extra- axial collection.  No pituitary mass.  No inflammatory sinus disease.  No skull or skull base lesion.  IMPRESSION:  7 mm acute infarction within the right thalamus.  Minimal small vessel changes elsewhere affecting the hemispheric white matter.  MRA HEAD  Findings: Both internal carotid arteries are widely patent into the brain.  No siphon stenosis.  The anterior and middle cerebral vessels are patent without proximal stenosis, aneurysm or vascular malformation.  Both vertebral arteries are patent with the left  terminating in pica.  The right vertebral artery supplies the basilar.  No basilar stenosis.  Both posterior cerebral arteries take a fetal origin from the anterior circulation.  Therefore, the posterior circulation is somewhat diminutive.  IMPRESSION: No major vessel occlusion or correctable proximal stenosis. Diminutive posterior circulation based on the presence of fetal origin of the posterior cerebral arteries from the anterior circulation.  Original Report Authenticated By: Thomasenia Sales, M.D.   Mr Laqueta Jean EX Contrast  03/09/2012  *RADIOLOGY REPORT*  Clinical Data:  Left facial numbness.  Headache.  MRI HEAD WITHOUT AND WITH CONTRAST MRA HEAD WITHOUT CONTRAST  Technique:  Multiplanar, multiecho pulse sequences of the brain and surrounding structures were obtained without and with intravenous contrast.  Angiographic images of the head were obtained using MRA technique without contrast.  Contrast: 20mL MULTIHANCE GADOBENATE DIMEGLUMINE 529 MG/ML IV SOLN  Comparison:  Head CT same day  MRI HEAD WITHOUT AND WITH CONTRAST  Findings:  There is a 7 mm acute infarction within the right thalamus.  No other acute infarction.  Elsewhere, there are minimal old small vessel changes within the hemispheric white matter.  No cortical or large vessel territory infarction.  No mass lesion, hemorrhage, hydrocephalus or extra- axial collection.  No pituitary mass.  No inflammatory sinus disease.  No skull or skull base lesion.  IMPRESSION:  7 mm acute infarction within the right thalamus.  Minimal small vessel changes elsewhere affecting the hemispheric white matter.  MRA HEAD  Findings: Both internal carotid arteries are widely patent into the brain.  No siphon stenosis.  The anterior and middle cerebral vessels are patent without proximal stenosis, aneurysm or vascular malformation.  Both vertebral arteries are patent with the left terminating in pica.  The right vertebral artery supplies the basilar.  No basilar stenosis.   Both posterior cerebral arteries take a fetal origin from the anterior circulation.  Therefore, the posterior circulation is somewhat diminutive.  IMPRESSION: No major vessel occlusion or correctable proximal stenosis. Diminutive posterior circulation based on the presence of fetal origin of the posterior cerebral arteries from the anterior circulation.  Original Report Authenticated By: Thomasenia Sales, M.D.    Assessment/Plan  . 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 Not a candidate for TPA as patient presented after the 3 hour window for treatment and patient woke up this morning with the symptoms hence onset of neurological deficits is unknown. - Will place on stroke protocol, obtain 2-D echo, carotid Dopplers, swallow screen, PT OT evaluation, lipid panel, hemoglobin A1c  - Patient is on aspirin 81 mg daily, hence most likely is going to need Plavix for stroke prevention. I  will defer to neurology service regarding Plavix.   Marland KitchenUTI (lower urinary tract infection) -Obtain urine culture, place on Rocephin IV daily   .HTN (hypertension), malignant: BP 172/98 in ED  - Will continue amlodipine, Avapro. Hold HCTZ for now to avoid lowering of BP and worsening of ischemic CVA   .HYPOTHYROIDISM - Obtain TSH, continue Synthroid   .HYPERLIPIDEMIA:  - Obtain lipid panel   .GERD: Continue PPI   .ASTHMA: Stable, not in any acute exacerbation  Diabetes mellitus:  - Obtain hemoglobin A1c, place on sliding-scale insulin and Lantus  DVT prophylaxis: Lovenox  CODE STATUS: Full code  Further plan will depend as patient's clinical course evolves and further radiologic and laboratory data become available.   @Time  Spent on Admission: 1 hour  RAI,RIPUDEEP M.D. [facility] 03/09/2012, 6:16 PM Pager: [phone]  If  7PM-7AM, please contact night-coverage [email or web address] Password [email or web address]

## 2012-03-09 NOTE — ED Notes (Addendum)
Pt c/o of tingling and numbness on left side of body. Pt reports when she woke up around 0700 today her fingers were tingling and her whole left side of her body progressively started to feel numb and tingling. Pt reports the left side of her mouth feels numb. Pt is A&O X 4. Pt has equal bilateral hand grips. No facial droop. No slurred speech. Pt in chair fanning herself.

## 2012-03-09 NOTE — Consult Note (Signed)
Referring Physician: Dr. Hyman Hopes    Chief Complaint: Numbness involving left face and hand.  HPI: [name] is an 66 y.o. female with a history of hypertension, hyperlipidemia and diabetes mellitus who woke up with numbness involving left side of her face and left hand today. She had no problems at bedtime last night which was about midnight. Has no previous history of stroke nor TIA. She's been taking aspirin 81 mg per day. Patient also complained of severe headache. CT scan of her head showed no signs of acute intracranial abnormality including no hemorrhage. Lumbar puncture was performed which showed no indications of subarachnoid hemorrhage. MRI showed a 7 mm acute infarction involving the right thalamus. NIH score was 1.  LSN: 12 AM today tPA Given: No: Beyond time under for treatment and minimal deficits. MRankin: 0  Past Medical History  Diagnosis Date  . Diabetes mellitus   . Sickle cell anemia   . Hyperlipidemia   . Thyroid disease   . Hypertension   . Ear infection   . Sinus problem     Family History  Problem Relation Age of Onset  . Heart disease Mother   . Hypertension Mother   . Diabetes Mother   . Heart disease Father   . Hypertension Father   . Diabetes Father   . Heart disease Sister   . Hypertension Sister   . Diabetes Sister   . Hypertension Brother   . Cancer Brother   . Diabetes Sister   . Hypertension Sister   . Diabetes Sister   . Hypertension Sister      Medications:  Prior to Admission:  Aspirin 81 mg per day Norvasc 5 mg per day Omega-3 fatty acid 1000 mg 2 per day Amaryl 4 mg twice a day Hydrochlorothiazide 25 mg per day Lantus 100 42 units per day Avapro 150 mg per day Synthroid 112 mcg per day Claritin 10 mg per day when necessary Prilosec 20 mg per day  Physical Examination: Blood pressure 144/77, pulse 70, temperature 98.3 F (36.8 C), temperature source Oral, resp. rate 18, SpO2 96.00%.  Neurologic Examination: Mental  Status: Alert, oriented, thought content appropriate.  Speech fluent without evidence of aphasia. Able to follow commands without difficulty. Cranial Nerves: II-Visual fields were normal. III/IV/VI-Pupils were equal and reacted. Extraocular movements were full and conjugate.    V/VII-mild left facial numbness to tactile stimulation; no facial weakness. VIII-normal. X-normal speech and symmetrical palatal movement. XII-midline tongue extension Motor: 5/5 bilaterally with normal tone and bulk Sensory: Decreased tactile stimulation of left hand compared to the right; no numbness in lower extremities. Deep Tendon Reflexes: 1+ and symmetric in upper extremities; 1+ at left knee and absent at right knee; absent ankle jerks. Plantars: Mute bilaterally Cerebellar: Normal finger-to-nose testing. Carotid auscultation: Normal   Ct Head Wo Contrast  03/09/2012  *RADIOLOGY REPORT*  Clinical Data: Left sided numbness and tingling  CT HEAD WITHOUT CONTRAST  Technique:  Contiguous axial images were obtained from the base of the skull through the vertex without contrast.  Comparison: None  Findings: The brain has a normal appearance without evidence for hemorrhage, infarction, hydrocephalus, or mass lesion.  There is no extra axial fluid collection.  The skull and paranasal sinuses are normal.  IMPRESSION: No acute intracranial abnormalities.  Original Report Authenticated By: Rosealee Albee, M.D.   Mr [employer] Wo Contrast  03/09/2012  *RADIOLOGY REPORT*  Clinical Data:  Left facial numbness.  Headache.  MRI HEAD WITHOUT AND WITH CONTRAST  MRA HEAD WITHOUT CONTRAST  Technique:  Multiplanar, multiecho pulse sequences of the brain and surrounding structures were obtained without and with intravenous contrast.  Angiographic images of the head were obtained using MRA technique without contrast.  Contrast: 20mL MULTIHANCE GADOBENATE DIMEGLUMINE 529 MG/ML IV SOLN  Comparison:  Head CT same day  MRI HEAD WITHOUT AND WITH  CONTRAST  Findings:  There is a 7 mm acute infarction within the right thalamus.  No other acute infarction.  Elsewhere, there are minimal old small vessel changes within the hemispheric white matter.  No cortical or large vessel territory infarction.  No mass lesion, hemorrhage, hydrocephalus or extra- axial collection.  No pituitary mass.  No inflammatory sinus disease.  No skull or skull base lesion.  IMPRESSION:  7 mm acute infarction within the right thalamus.  Minimal small vessel changes elsewhere affecting the hemispheric white matter.  MRA HEAD  Findings: Both internal carotid arteries are widely patent into the brain.  No siphon stenosis.  The anterior and middle cerebral vessels are patent without proximal stenosis, aneurysm or vascular malformation.  Both vertebral arteries are patent with the left terminating in pica.  The right vertebral artery supplies the basilar.  No basilar stenosis.  Both posterior cerebral arteries take a fetal origin from the anterior circulation.  Therefore, the posterior circulation is somewhat diminutive.  IMPRESSION: No major vessel occlusion or correctable proximal stenosis. Diminutive posterior circulation based on the presence of fetal origin of the posterior cerebral arteries from the anterior circulation.  Original Report Authenticated By: Thomasenia Sales, M.D.   Mr Laqueta Jean AV Contrast  03/09/2012  *RADIOLOGY REPORT*  Clinical Data:  Left facial numbness.  Headache.  MRI HEAD WITHOUT AND WITH CONTRAST MRA HEAD WITHOUT CONTRAST  Technique:  Multiplanar, multiecho pulse sequences of the brain and surrounding structures were obtained without and with intravenous contrast.  Angiographic images of the head were obtained using MRA technique without contrast.  Contrast: 20mL MULTIHANCE GADOBENATE DIMEGLUMINE 529 MG/ML IV SOLN  Comparison:  Head CT same day  MRI HEAD WITHOUT AND WITH CONTRAST  Findings:  There is a 7 mm acute infarction within the right thalamus.  No other  acute infarction.  Elsewhere, there are minimal old small vessel changes within the hemispheric white matter.  No cortical or large vessel territory infarction.  No mass lesion, hemorrhage, hydrocephalus or extra- axial collection.  No pituitary mass.  No inflammatory sinus disease.  No skull or skull base lesion.  IMPRESSION:  7 mm acute infarction within the right thalamus.  Minimal small vessel changes elsewhere affecting the hemispheric white matter.  MRA HEAD  Findings: Both internal carotid arteries are widely patent into the brain.  No siphon stenosis.  The anterior and middle cerebral vessels are patent without proximal stenosis, aneurysm or vascular malformation.  Both vertebral arteries are patent with the left terminating in pica.  The right vertebral artery supplies the basilar.  No basilar stenosis.  Both posterior cerebral arteries take a fetal origin from the anterior circulation.  Therefore, the posterior circulation is somewhat diminutive.  IMPRESSION: No major vessel occlusion or correctable proximal stenosis. Diminutive posterior circulation based on the presence of fetal origin of the posterior cerebral arteries from the anterior circulation.  Original Report Authenticated By: Thomasenia Sales, M.D.    Assessment: 66 y.o. 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  Stroke Risk Factors - diabetes mellitus, hyperlipidemia and hypertension  Plan: 1. HgbA1c, fasting lipid panel 2.  PT consult, OT consult, Speech consult 3. Echocardiogram 4. Carotid dopplers 5. Prophylactic therapy-Antiplatelet med: Plavix 75 mg per day 6. Risk factor modification 7. Telemetry monitoring  C.R. Roseanne Reno, MD [facility] [phone]  03/09/2012, 5:27 PM

## 2012-03-09 NOTE — ED Notes (Signed)
Patient transported to CT 

## 2012-03-10 DIAGNOSIS — K219 Gastro-esophageal reflux disease without esophagitis: Secondary | ICD-10-CM

## 2012-03-10 DIAGNOSIS — R51 Headache: Secondary | ICD-10-CM

## 2012-03-10 DIAGNOSIS — E119 Type 2 diabetes mellitus without complications: Secondary | ICD-10-CM

## 2012-03-10 LAB — LIPID PANEL
Cholesterol: 225 mg/dL — ABNORMAL HIGH (ref 0–200)
LDL Cholesterol: 154 mg/dL — ABNORMAL HIGH (ref 0–99)
Total CHOL/HDL Ratio: 7 RATIO
VLDL: 39 mg/dL (ref 0–40)

## 2012-03-10 LAB — GLUCOSE, CAPILLARY
Glucose-Capillary: 151 mg/dL — ABNORMAL HIGH (ref 70–99)
Glucose-Capillary: 152 mg/dL — ABNORMAL HIGH (ref 70–99)
Glucose-Capillary: 213 mg/dL — ABNORMAL HIGH (ref 70–99)

## 2012-03-10 LAB — HEMOGLOBIN A1C
Hgb A1c MFr Bld: 10 % — ABNORMAL HIGH (ref <5.7)
Mean Plasma Glucose: 240 mg/dL — ABNORMAL HIGH (ref <117)

## 2012-03-10 MED ORDER — GUAIFENESIN 100 MG/5ML PO SOLN
200.0000 mg | ORAL | Status: DC | PRN
  Administered 2012-03-10 – 2012-03-11 (×2): 200 mg via ORAL
  Filled 2012-03-10 (×2): qty 118
  Filled 2012-03-10: qty 10

## 2012-03-10 MED ORDER — ATORVASTATIN CALCIUM 20 MG PO TABS
20.0000 mg | ORAL_TABLET | Freq: Every day | ORAL | Status: DC
  Administered 2012-03-10: 20 mg via ORAL
  Filled 2012-03-10 (×2): qty 1

## 2012-03-10 MED ORDER — CLOPIDOGREL BISULFATE 75 MG PO TABS
75.0000 mg | ORAL_TABLET | Freq: Every day | ORAL | Status: DC
  Administered 2012-03-10 – 2012-03-11 (×2): 75 mg via ORAL
  Filled 2012-03-10 (×2): qty 1

## 2012-03-10 MED ORDER — INSULIN GLARGINE 100 UNIT/ML ~~LOC~~ SOLN
45.0000 [IU] | Freq: Every day | SUBCUTANEOUS | Status: DC
  Administered 2012-03-10 – 2012-03-11 (×2): 45 [IU] via SUBCUTANEOUS

## 2012-03-10 MED ORDER — STROKE: EARLY STAGES OF RECOVERY BOOK
Freq: Once | Status: AC
  Administered 2012-03-10 (×2)
  Filled 2012-03-10: qty 1

## 2012-03-10 NOTE — Progress Notes (Signed)
Admission History: [name] is an 66 y.o. female with a history of hypertension, hyperlipidemia and diabetes mellitus who woke up around 7 am with numbness involving left side of her face and left hand today. She had no problems at bedtime 03/08/12 which was about midnight, except a right temporal headache. Has no previous history of stroke nor TIA. She's been taking aspirin 81 mg per day. CT scan of her head showed no signs of acute intracranial abnormality including no hemorrhage. Lumbar puncture was performed which showed no indications of subarachnoid hemorrhage. MRI showed a 7 mm acute infarction involving the right thalamus. NIH score was 1.   LSN: midnight 03/08/12 tPA Given: No: Beyond time under for treatment and minimal deficits.   Subjective: I'm about the same.  Numbness left hemibody including face persists.  Left arm and leg weaker than right.  No headache, dizziness, or visual disturbance.  Objective: BP 130/88  Pulse 88  Temp 97.7 F (36.5 C) (Oral)  Resp 16  Ht 5' 4.5" (1.638 m)  Wt 94.4 kg (208 lb 1.8 oz)  BMI 35.17 kg/m2  SpO2 100%  CBGs  Basename 03/10/12 0744 03/09/12 2157 03/09/12 1002  GLUCAP 151* 363* 170*   Diet: heart healthy, thin  Activity: up ad lib with assist  DVT Prophylaxis: lovenox   Medications: Scheduled:   .  stroke: mapping our early stages of recovery book   Does not apply Once  . sodium chloride   Intravenous STAT  . amLODipine  5 mg Oral Daily  . aspirin  324 mg Oral Once  . aspirin  300 mg Rectal Daily   Or  . aspirin  325 mg Oral Daily  . cefTRIAXone (ROCEPHIN)  IV  1 g Intravenous Q24H  . cefTRIAXone (ROCEPHIN)  IV  1 g Intravenous Once  . clopidogrel  75 mg Oral Q breakfast  . dexamethasone  10 mg Intravenous Once  . diphenhydrAMINE  25 mg Intravenous Once  . enoxaparin (LOVENOX) injection  40 mg Subcutaneous Q24H  . insulin aspart  0-5 Units Subcutaneous QHS  . insulin aspart  0-9 Units Subcutaneous TID WC  . insulin  glargine  42 Units Subcutaneous Daily  . irbesartan  150 mg Oral Daily  . levothyroxine  112 mcg Oral QAC breakfast  . metoCLOPramide (REGLAN) injection  10 mg Intravenous Once  . omega-3 acid ethyl esters  1 g Oral Daily  . pantoprazole  40 mg Oral Q1200  . sodium chloride  1,000 mL Intravenous Once  . DISCONTD: fish oil-omega-3 fatty acids  2 g Oral Daily    Neurologic Exam: Mental Status: Alert, oriented, thought content appropriate.  Speech fluent without evidence of aphasia. Able to follow 3 step commands without difficulty. Cranial Nerves: II- Visual fields grossly intact. III/IV/VI-Extraocular movements intact.  Pupils reactive bilaterally. V/VII-minimal left lower facial droop VIII-hearing grossly intact XI-bilateral shoulder shrug XII-midline tongue extension Motor: 5/5 bilaterally with normal tone and bulk, slightly weaker grip left compared to right Sensory: diminished left hemibody Deep Tendon Reflexes: 2+ and symmetric throughout Plantars: Downgoing bilaterally Cerebellar: Normal finger-to-nose, normal rapid alternating movements.  Lab Results: Basic Metabolic Panel:  Lab 03/09/12 1610 03/09/12 1155  NA -- 137  K -- 3.6  CL -- 100  CO2 -- 26  GLUCOSE -- 142*  BUN -- 11  CREATININE 0.75 0.70  CALCIUM -- 9.6  MG -- --  PHOS -- --   CBC:  Lab 03/09/12 2217 03/09/12 1155  WBC 9.0 8.6  NEUTROABS --  4.2  HGB 14.5 14.2  HCT 42.7 43.1  MCV 86.3 86.5  PLT 236 199   CBG:  Lab 03/10/12 0744 03/09/12 2157 03/09/12 1002  GLUCAP 151* 363* 170*   Fasting Lipid Panel:  Lab 03/10/12 0500  CHOL 225*  HDL 32*  LDLCALC 154*  TRIG 193*  CHOLHDL 7.0  LDLDIRECT --   Urinalysis:  Lab 03/09/12 1401  COLORURINE YELLOW  LABSPEC 1.018  PHURINE 5.0  GLUCOSEU NEGATIVE  HGBUR NEGATIVE  BILIRUBINUR NEGATIVE  KETONESUR NEGATIVE  PROTEINUR NEGATIVE  UROBILINOGEN 0.2  NITRITE NEGATIVE  LEUKOCYTESUR SMALL*   Misc. Labs  Study Results:   03/09/2012 CT HEAD  WITHOUT CONTRAST Findings: The brain has a normal appearance without evidence for hemorrhage, infarction, hydrocephalus, or mass lesion.  There is no extra axial fluid collection.  The skull and paranasal sinuses are normal.  IMPRESSION: No acute intracranial abnormalities. Rosealee Albee, M.D.   03/09/2012   MRI HEAD WITHOUT AND WITH CONTRAST Findings:  There is a 7 mm acute infarction within the right thalamus.  No other acute infarction.  Elsewhere, there are minimal old small vessel changes within the hemispheric white matter.  No cortical or large vessel territory infarction.  No mass lesion, hemorrhage, hydrocephalus or extra- axial collection.  No pituitary mass.  No inflammatory sinus disease.  No skull or skull base lesion.  IMPRESSION:  7 mm acute infarction within the right thalamus.  Minimal small vessel changes elsewhere affecting the hemispheric white matter.  Thomasenia Sales, M.D.   03/09/2012  MRA HEAD WITHOUT CONTRAST   Findings: Both internal carotid arteries are widely patent into the brain.  No siphon stenosis.  The anterior and middle cerebral vessels are patent without proximal stenosis, aneurysm or vascular malformation.  Both vertebral arteries are patent with the left terminating in pica.  The right vertebral artery supplies the basilar.  No basilar stenosis.  Both posterior cerebral arteries take a fetal origin from the anterior circulation.  Therefore, the posterior circulation is somewhat diminutive.  IMPRESSION: No major vessel occlusion or correctable proximal stenosis. Diminutive posterior circulation based on the presence of fetal origin of the posterior cerebral arteries from the anterior circulation.  Thomasenia Sales, M.D.   Therapies: PT: home PT  Assessment/Plan: 66 y.o. 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 Mild left hemiparesis/ altered sensation. Stroke work up underway.  Was on ASA 81 mg at home.  LDL 154; not at goal LDL ,  70  Stroke Risk Factors - diabetes mellitus, hyperlipidemia and hypertension   Plan:  1. HgbA1c 2. PT consult, OT consult, Speech consult  3. Echocardiogram  4. Carotid dopplers  5. Prophylactic therapy-Antiplatelet med: Plavix 75 mg per day  6. Risk factor modification  7. Telemetry monitoring 8. Add lipitor 20 mg daily.   LOS: 1 day   Jana Hakim [facility] [phone] 03/10/2012  9:50 AM  I have personally examined this patient, reviewed chart and developed plan of care. Delia Heady, MD Medical Director [facility] Pager: [phone] 03/10/2012 11:41 AM

## 2012-03-10 NOTE — Progress Notes (Signed)
SLP Cancellation Note  ST received order for SLE per stroke protocol. ST to address on 03/11/12 Moreen Fowler MS, CCC-SLP [phone]  [facility] 03/10/2012, 3:13 PM

## 2012-03-10 NOTE — Progress Notes (Signed)
Patient ID: [name]  female  ZOX:[id number]    DOB: Oct 27, 1945    DOA: 03/09/2012  PCP: Sanjuana Letters, MD  Subjective: Per patient, left facial numbness improving however still persisting around the lips. She still feels off-balance when ambulating to the bathroom and feels weakness in the left arm and left leg. Headache improving, no nausea vomiting, abdominal pain or any diarrhea.   Objective: Weight change:   Intake/Output Summary (Last 24 hours) at 03/10/12 1012 Last data filed at 03/10/12 0900  Gross per 24 hour  Intake  927.5 ml  Output      0 ml  Net  927.5 ml   Blood pressure 136/68, pulse 80, temperature 97.7 F (36.5 C), temperature source Oral, resp. rate 16, height 5' 4.5" (1.638 m), weight 94.4 kg (208 lb 1.8 oz), SpO2 100.00%.  Physical Exam: General: Alert and awake, oriented x3, not in any acute distress, no dysarthria or aphasia. HEENT: anicteric sclera, pupils reactive to light and accommodation, EOMI CVS: S1-S2 clear, no murmur rubs or gallops Chest: clear to auscultation bilaterally, no wheezing, rales or rhonchi Abdomen: soft nontender, nondistended, normal bowel sounds, no organomegaly Extremities: no cyanosis, clubbing or edema noted bilaterally Neuro: 5/ 5 both lower extremities bilaterally although slightly weaker grip compared to the right side  Lab Results: Basic Metabolic Panel:  Lab 03/09/12 8119 03/09/12 1155  NA -- 137  K -- 3.6  CL -- 100  CO2 -- 26  GLUCOSE -- 142*  BUN -- 11  CREATININE 0.75 0.70  CALCIUM -- 9.6  MG -- --  PHOS -- --  CBC:  Lab 03/09/12 2217 03/09/12 1155  WBC 9.0 8.6  NEUTROABS -- 4.2  HGB 14.5 14.2  HCT 42.7 43.1  MCV 86.3 86.5  PLT 236 199   CBG:  Lab 03/10/12 0744 03/09/12 2157 03/09/12 1002  GLUCAP 151* 363* 170*     Micro Results: No results found for this or any previous visit (from the past 240 hour(s)).  Studies/Results: Ct Head Wo Contrast  03/09/2012  *IMPRESSION: No acute  intracranial abnormalities.  Original Report Authenticated By: Rosealee Albee, M.D.   Mr Maxine Glenn Head Wo Contrast  03/09/2012   IMPRESSION:  7 mm acute infarction within the right thalamus.  Minimal small vessel changes elsewhere affecting the hemispheric white matter.  Original Report Authenticated By: Thomasenia Sales, M.D.   Mr Laqueta Jean Wo Contrast  03/09/2012  IMPRESSION: No major vessel occlusion or correctable proximal stenosis. Diminutive posterior circulation based on the presence of fetal origin of the posterior cerebral arteries from the anterior circulation.  Original Report Authenticated By: Thomasenia Sales, M.D.    Medications: Scheduled Meds:   .  stroke: mapping our early stages of recovery book   Does not apply Once  . sodium chloride   Intravenous STAT  . amLODipine  5 mg Oral Daily  . aspirin  324 mg Oral Once  . atorvastatin  20 mg Oral q1800  . cefTRIAXone (ROCEPHIN)  IV  1 g Intravenous Q24H  . cefTRIAXone (ROCEPHIN)  IV  1 g Intravenous Once  . clopidogrel  75 mg Oral Q breakfast  . dexamethasone  10 mg Intravenous Once  . diphenhydrAMINE  25 mg Intravenous Once  . enoxaparin (LOVENOX) injection  40 mg Subcutaneous Q24H  . insulin aspart  0-5 Units Subcutaneous QHS  . insulin aspart  0-9 Units Subcutaneous TID WC  . insulin glargine  42 Units Subcutaneous Daily  . irbesartan  150  mg Oral Daily  . levothyroxine  112 mcg Oral QAC breakfast  . metoCLOPramide (REGLAN) injection  10 mg Intravenous Once  . omega-3 acid ethyl esters  1 g Oral Daily  . pantoprazole  40 mg Oral Q1200  . sodium chloride  1,000 mL Intravenous Once  . DISCONTD: aspirin  300 mg Rectal Daily  . DISCONTD: aspirin  325 mg Oral Daily  . DISCONTD: fish oil-omega-3 fatty acids  2 g Oral Daily   Continuous Infusions:   . sodium chloride 75 mL/hr at 03/10/12 0400     Assessment/Plan: Principal Problem:  *CVA (cerebral infarction): Confirmed acute right thalamic small vessel ischemic CVA on the  MRI with persistence of mild left hemiparesis and paresthesias, was on aspirin 81 mg daily - Placed on Plavix 75 mg daily today - Stroke workup in progress, 2-D echo and carotid Dopplers today - Started on Lipitor for hyperlipidemia, LDL 154 triglycerides 193 cholesterol 225 - PT, OT, speech consult pending  Active Problems:  HYPOTHYROIDISM:  - Order TSH, continue Synthroid   HYPERLIPIDEMIA: Lipid panel LDL 154 triglycerides 193 cholesterol 225 - Started on Lipitor today   ASTHMA: Stable   GERD: Continue PPI   UTI (lower urinary tract infection):  - Urine culture pending, continue Rocephin, day 2   HTN (hypertension), malignant: BP somewhat improved  Diabetes mellitus: Uncontrolled - Obtain hemoglobin A1c, increase Lantus to 45 units, continue sliding scale insulin  DVT Prophylaxis: Lovenox  Code Status: Full code  Disposition: Hopefully tomorrow   LOS: 1 day   Cesareo Vickrey M.D. [facility] 03/10/2012, 10:12 AM Pager: [phone]  If 7PM-7AM, please contact night-coverage [email or web address] Password [email or web address]

## 2012-03-10 NOTE — Evaluation (Signed)
Physical Therapy Evaluation Patient Details Name: [name] MRN: [id number] DOB: 1946-08-17 Today's Date: 03/10/2012 Time: 4098-1191 PT Time Calculation (min): 27 min  PT Assessment / Plan / Recommendation Clinical Impression  Patient is a 66 yo female admitted with right CVA with left hemiparesis.  Patient with slightly decreased balance impacting safety.  Recommend use of RW and HHPT at discharge.  Will follow acutely for functional mobility and balance training.  Provided patient with education on warning signs and risk factors for CVA.    PT Assessment  Patient needs continued PT services    Follow Up Recommendations  Home health PT;Supervision - Intermittent    Barriers to Discharge        Equipment Recommendations  Rolling walker with 5" wheels    Recommendations for Other Services     Frequency Min 4X/week    Precautions / Restrictions Precautions Precautions: Fall Restrictions Weight Bearing Restrictions: No         Mobility  Bed Mobility Bed Mobility: Supine to Sit;Sit to Supine Supine to Sit: 7: Independent;HOB flat Sit to Supine: 7: Independent;HOB flat Details for Bed Mobility Assistance: No assist required Transfers Transfers: Sit to Stand;Stand to Sit Sit to Stand: 4: Min guard;With upper extremity assist;From bed Stand to Sit: 4: Min guard;With upper extremity assist;To bed Details for Transfer Assistance: Min guard due to slight decrease in balance due to decreased control LLE Ambulation/Gait Ambulation/Gait Assistance: 4: Min assist Ambulation Distance (Feet): 96 Feet Assistive device: None Ambulation/Gait Assistance Details: Patient with decreased balance and decreased left knee control during gait without assistive device.  Min assist to prevent falls.  Cues to focus on left knee during stance, and to stand upright. Gait Pattern: Step-through pattern;Decreased stride length;Decreased stance time - left Modified Rankin (Stroke Patients  Only) Pre-Morbid Rankin Score: No symptoms Modified Rankin: Slight disability    Exercises     PT Diagnosis: Abnormality of gait;Generalized weakness  PT Problem List: Decreased strength;Decreased activity tolerance;Decreased balance;Decreased mobility;Decreased knowledge of use of DME;Impaired sensation PT Treatment Interventions: DME instruction;Gait training;Functional mobility training;Balance training;Patient/family education   PT Goals Acute Rehab PT Goals PT Goal Formulation: With patient Time For Goal Achievement: 03/17/12 Potential to Achieve Goals: Good Pt will go Sit to Stand: with modified independence PT Goal: Sit to Stand - Progress: Goal set today Pt will go Stand to Sit: with modified independence PT Goal: Stand to Sit - Progress: Goal set today Pt will Ambulate: >150 feet;with modified independence;with least restrictive assistive device PT Goal: Ambulate - Progress: Goal set today  Visit Information  Last PT Received On: 03/10/12 Assistance Needed: +1    Subjective Data  Subjective: I think I'll be fine Patient Stated Goal: To go home today.  To get my leg stronger   Prior Functioning  Home Living Lives With: Alone Available Help at Discharge: Family;Friend(s);Available PRN/intermittently Type of Home: Apartment Home Access: Level entry Home Layout: One level Bathroom Shower/Tub: Engineer, manufacturing systems: Standard Home Adaptive Equipment: None Prior Function Level of Independence: Independent Able to Take Stairs?: Yes Driving: Yes Vocation: Retired Musician: No difficulties Dominant Hand: Right    Cognition  Overall Cognitive Status: Appears within functional limits for tasks assessed/performed Arousal/Alertness: Awake/alert Orientation Level: Appears intact for tasks assessed Behavior During Session: [facility] for tasks performed    Extremity/Trunk Assessment Right Upper Extremity Assessment RUE ROM/Strength/Tone: Within  functional levels RUE Sensation: WFL - Light Touch Left Upper Extremity Assessment LUE ROM/Strength/Tone: Deficits LUE ROM/Strength/Tone Deficits: Strength 4-/5  LUE  Sensation: WFL - Light Touch LUE Coordination: Deficits LUE Coordination Deficits: Decreased fine motor coordination Right Lower Extremity Assessment RLE ROM/Strength/Tone: Within functional levels RLE Sensation: WFL - Light Touch Left Lower Extremity Assessment LLE ROM/Strength/Tone: Deficits LLE ROM/Strength/Tone Deficits: Strength grossly 4-/5 LLE Sensation: WFL - Light Touch LLE Coordination: Deficits LLE Coordination Deficits: Decreased coordination    Balance Balance Balance Assessed: Yes High Level Balance High Level Balance Activites: Direction changes;Turns;Head turns High Level Balance Comments: Decrease in balance without assistive device during high level balance activities  End of Session PT - End of Session Equipment Utilized During Treatment: Gait belt Activity Tolerance: Patient tolerated treatment well Patient left: in bed;with call bell/phone within reach Nurse Communication: Mobility status  GP     Vena Austria 03/10/2012, 8:45 AM Durenda Hurt. Renaldo Fiddler, [facility] Acute Rehab Services Pager [phone]

## 2012-03-10 NOTE — Progress Notes (Signed)
VASCULAR LAB PRELIMINARY  PRELIMINARY  PRELIMINARY  PRELIMINARY  Carotid Dopplers completed.    Preliminary report:  There is no ICA stenosis.  Vertebral artery flow is antegrade.  Isrrael Fluckiger, 03/10/2012, 12:16 PM

## 2012-03-10 NOTE — Progress Notes (Signed)
  Echocardiogram 2D Echocardiogram has been performed.  Georgian Co 03/10/2012, 2:05 PM

## 2012-03-11 DIAGNOSIS — J309 Allergic rhinitis, unspecified: Secondary | ICD-10-CM

## 2012-03-11 LAB — CBC
HCT: 40.5 % (ref 36.0–46.0)
Hemoglobin: 12.9 g/dL (ref 12.0–15.0)
MCH: 27.6 pg (ref 26.0–34.0)
MCHC: 31.9 g/dL (ref 30.0–36.0)
Platelets: 215 10*3/uL (ref 150–400)

## 2012-03-11 LAB — GLUCOSE, CAPILLARY
Glucose-Capillary: 108 mg/dL — ABNORMAL HIGH (ref 70–99)
Glucose-Capillary: 161 mg/dL — ABNORMAL HIGH (ref 70–99)

## 2012-03-11 LAB — BASIC METABOLIC PANEL
CO2: 23 mEq/L (ref 19–32)
Chloride: 106 mEq/L (ref 96–112)
Potassium: 3.6 mEq/L (ref 3.5–5.1)
Sodium: 140 mEq/L (ref 135–145)

## 2012-03-11 LAB — TSH: TSH: 0.578 u[IU]/mL (ref 0.350–4.500)

## 2012-03-11 MED ORDER — AMOXICILLIN-POT CLAVULANATE 875-125 MG PO TABS
1.0000 | ORAL_TABLET | Freq: Two times a day (BID) | ORAL | Status: DC
  Administered 2012-03-11: 1 via ORAL
  Filled 2012-03-11 (×2): qty 1

## 2012-03-11 MED ORDER — HYDROCHLOROTHIAZIDE 25 MG PO TABS
25.0000 mg | ORAL_TABLET | Freq: Every day | ORAL | Status: DC
  Administered 2012-03-11: 25 mg via ORAL
  Filled 2012-03-11: qty 1

## 2012-03-11 MED ORDER — LORAZEPAM 1 MG PO TABS: 1.0000 mg | ORAL_TABLET | Freq: Two times a day (BID) | ORAL | Status: DC | PRN

## 2012-03-11 MED ORDER — CLOPIDOGREL BISULFATE 75 MG PO TABS: 75.0000 mg | ORAL_TABLET | Freq: Every day | ORAL | Status: DC

## 2012-03-11 MED ORDER — GUAIFENESIN 100 MG/5ML PO SOLN: 200.0000 mg | ORAL | Status: DC | PRN

## 2012-03-11 MED ORDER — ATORVASTATIN CALCIUM 20 MG PO TABS: 20.0000 mg | ORAL_TABLET | Freq: Every day | ORAL | Status: DC

## 2012-03-11 MED ORDER — AMOXICILLIN-POT CLAVULANATE 875-125 MG PO TABS: 1.0000 | ORAL_TABLET | Freq: Two times a day (BID) | ORAL | Status: DC

## 2012-03-11 NOTE — Discharge Summary (Signed)
Physician Discharge Summary  Patient ID: [name] MRN: [id number] DOB/AGE: 08-Mar-1946 66 y.o.  Admit date: 03/09/2012 Discharge date: 03/11/2012  Primary Care Physician:  Georganna Skeans, MD  Discharge Diagnoses:    . 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      amoxicillin-clavulanate 875-125 MG per tablet   Commonly known as: AUGMENTIN   Take 1 tablet by mouth 2 (two) times daily. x 10 days      atorvastatin 20 MG tablet   Commonly known as: LIPITOR   Take 1 tablet (20 mg total) by mouth daily.      clopidogrel 75 MG tablet   Commonly known as: PLAVIX   Take 1 tablet (75 mg total) by mouth daily.      fish oil-omega-3 fatty acids 1000 MG capsule   Take 2 g by mouth daily.      glimepiride 4 MG tablet   Commonly known as: AMARYL   Take 4 mg by mouth 2 (two) times daily.      guaiFENesin 100 MG/5ML Soln   Commonly known as: ROBITUSSIN   Take 10 mLs (200 mg total) by mouth every 4 (four) hours as needed (cough, sore throat).      hydrochlorothiazide 25 MG tablet   Commonly known as: HYDRODIURIL   Take 25 mg by mouth daily.      insulin glargine 100 UNIT/ML injection   Commonly known as: LANTUS   Inject 42 Units into the skin daily.      irbesartan 150 MG tablet   Commonly known as: AVAPRO   Take 150 mg by mouth daily.      levothyroxine 112 MCG tablet   Commonly known as: SYNTHROID, LEVOTHROID   Take 112 mcg by mouth daily.      Loratadine 10 MG Caps   Take 10 mg by mouth daily as  needed. For allergies      LORazepam 1 MG tablet   Commonly known as: ATIVAN   Take 1 tablet (1 mg total) by mouth 2 (two) times daily as needed for anxiety.      omeprazole 20 MG capsule   Commonly known as: PRILOSEC   Take 20 mg by mouth daily.             Brief H and P: For complete details please refer to admission H and P, but in brief Patient is a 66 year old female with history of diabetes, hyperlipidemia, hypertension, hypothyroidism presented to ED with sudden onset of numbness involving left face and left hand this morning. History was obtained from the patient who states that she started having headache last night but no other neurological symptoms. This morning she woke up with numbness involving her left lips which progressed to left side of the face and left fingers. Patient denies any prior history of stroke or TIA. She takes aspirin 81 mg daily.  Lumbar puncture was done in the ED which showed no indication of acute infection or  subarachnoid hemorrhage. MRI was done which showed 7 mm acute infarction involving the right thalamus. She also felt somewhat off balance however reports no falls or syncope.   Hospital Course:  Patient is a 66 year old female with history of hypertension, hyperlipidemia, diabetes who woke up around 7 AM with numbness involving the left side of her face and left hand, left leg weakness. Patient had been taking aspirin 81 mg per day. MRI in the ED showed 7 mmHg her infarction involving the right thalamus. Acute CVA (cerebral infarction): Confirmed acute right thalamic small vessel ischemic CVA on the MRI with persistence of mild left hemiparesis and paresthesias. Patient was not TPA candidate as she had presented beyond the time for the treatment and patient woke up with the symptoms, hence time of onset unknown. Patient was admitted to the telemetry floor, she did not have any acute arrhythmias. Neurology was consulted and patient was placed on Plavix 75  mg daily today. She underwent 2-D echocardiogram and carotid Dopplers. Patient was noted to have hyperlipidemia with LDL 154 triglycerides 193 cholesterol 225 and she was started on Lipitor. PT OT evaluations were done and patient is recommended home PT, OT, RN for followup other resources including the rolling walker and 3in1 were provided.  HYPOTHYROIDISM: TSH is 0.578, patient was continued on Synthroid   HYPERLIPIDEMIA: Lipid panel LDL 154 triglycerides 193 cholesterol 225, continue Lipitor   ASTHMA: Stable, acute issues   UTI (lower urinary tract infection): Patient was placed on Rocephin   Day of Discharge BP 152/88  Pulse 68  Temp 98.3 F (36.8 C) (Oral)  Resp 16  Ht 5' 4.5" (1.638 m)  Wt 94.4 kg (208 lb 1.8 oz)  BMI 35.17 kg/m2  SpO2 97%  Physical Exam: General: Alert and awake oriented x3 not in any acute distress. HEENT: anicteric sclera, pupils reactive to light and accommodation CVS: S1-S2 clear no murmur rubs or gallops Chest: clear to auscultation bilaterally, no wheezing rales or rhonchi Abdomen: soft nontender, nondistended, normal bowel sounds, no organomegaly Extremities: no cyanosis, clubbing or edema noted bilaterally   The results of significant diagnostics from this hospitalization (including imaging, microbiology, ancillary and laboratory) are listed below for reference.    LAB RESULTS: Basic Metabolic Panel:  Lab 03/11/12 4540 03/09/12 2217 03/09/12 1155  NA 140 -- 137  K 3.6 -- 3.6  CL 106 -- 100  CO2 23 -- 26  GLUCOSE 124* -- 142*  BUN 14 -- 11  CREATININE 0.67 0.75 --  CALCIUM 8.8 -- 9.6  MG -- -- --  PHOS -- -- --   CBC:  Lab 03/11/12 0530 03/09/12 2217 03/09/12 1155  WBC 11.5* 9.0 --  NEUTROABS -- -- 4.2  HGB 12.9 14.5 --  HCT 40.5 42.7 --  MCV 86.7 -- --  PLT 215 236 --   CBG:  Lab 03/11/12 0726 03/10/12 2132  GLUCAP 108* 213*    Significant Diagnostic Studies:  Ct Head Wo Contrast  03/09/2012  IMPRESSION: No acute  intracranial abnormalities.  Original Report Authenticated By: Rosealee Albee, M.D.   Mr Maxine Glenn Head Wo Contrast  03/09/2012  IMPRESSION:  7 mm acute infarction within the right thalamus.  Minimal small vessel changes elsewhere affecting the hemispheric white matter.   Original Report Authenticated By: Thomasenia Sales, M.D.   Mr Laqueta Jean Wo Contrast  03/09/2012   IMPRESSION: No major vessel occlusion or correctable proximal stenosis. Diminutive posterior circulation based on the presence of fetal origin of the posterior cerebral  arteries from the anterior circulation.  Original Report Authenticated By: Thomasenia Sales, M.D.   03/10/2012 Carotid Doppler No internal carotid artery stenosis bilaterally. Vertebrals with antegrade flow bilaterally.   03/10/2012 2D echo EF 55-60% with no source of embolus.      Disposition and Follow-up: Discharge Orders    Future Orders Please Complete By Expires   Diet Carb Modified      Increase activity slowly      Discharge instructions      Comments:   Please check Lipid panel, liver function tests, Hemoglobin A1C in 4-6 weeks for follow-up.       DISPOSITION: , DIET: Carb modified ACTIVITY: As tolerated  DISCHARGE FOLLOW-UP Follow-up Information    Follow up with Gates Rigg, MD. Schedule an appointment as soon as possible for a visit in 2 months. (stroke doctor)    Contact information:   [address], [address] [facility] [place] [address] [phone]       Follow up with Georganna Skeans, MD. Schedule an appointment as soon as possible for a visit in 10 days. (for hospital follow-up)    Contact information:   [address] [place] [address] [phone]          Time spent on Discharge: 45 minutes Signed:   RAI,RIPUDEEP M.D. [facility] 03/11/2012, 11:11 AM Pager: [phone]  If 7PM-7AM, please contact night-coverage [email or web address] Password [email or web address]

## 2012-03-11 NOTE — Progress Notes (Signed)
HOME HEALTH AGENCIES SERVING [place]   Agencies that are Medicare-Certified and are affiliated with [facility]  Telephone Number Address  [facility]   The [facility] has ownership interest in this company; however, you are under no obligation to use this agency. [phone] or  [phone] [address] [place], [place] [address]   Agencies that are Medicare-Certified and are not affiliated with [facility]                                                                                 Home Health Agency Telephone Number Address  [facility] [phone] Fax [phone] [address] [place], [place]  [address]  [facility] [phone] or [phone] Fax [phone] [address] [place], [place] [address]  [facility] [phone] Fax [phone] [address] [place], [place] [address]  [facility] [phone] Fax [phone] [address], [address] [place], [place]  [address]  [facility] [phone] Fax [phone] [address] [place], [place] [address]  [facility] [phone] [address] [place], [place] [address]  [facility] [phone]  [address] [place], [place] [address]  [facility] [phone] or [phone] Fax [phone] [address] [place], [place]  [address]  [facility] [phone] Fax [phone] [address] [place], [place]  [address]  [facility]  [phone] Fax [phone] [address] [place], [place] [address]               Agencies that are not Medicare-Certified and are not affiliated with [facility]   Home Health Agency Telephone Number Address  [facility] [phone] or [phone] Fax [phone] [address] [place], [place]  [address]  [facility] [phone] Fax [phone] [address] [place], [place]  [address]  [facility]  [phone] Fax [phone] [address] [place], [place] [address]  [facility] [phone] Fax [phone] [address] [place], [place] [address]  [facility] [phone] or [phone] Fax [phone] [address] [place], [place]  [address]  [facility] [phone] or [phone] Fax [phone] [address] [place], [place]  [address]  [facility] [phone] Fax [phone] [address] [place], [place]  [address]  [facility] [phone] [address] [place], [place]  [address]  [facility] [phone] Fax [phone] [address] [place], [place]  [address]  [facility] [phone] Fax [phone] [address] [place], [place]  [address]  [facility] [phone] Fax [phone] [address] [place],  [address]  [facility] [phone] Fax [phone] [address] [place],   [address]   

## 2012-03-11 NOTE — Care Management Note (Signed)
    Page 1 of 2   03/11/2012     11:21:06 AM   CARE MANAGEMENT NOTE 03/11/2012  Patient:  [name], [name]   Account Number:  [id number]  Date Initiated:  03/11/2012  Documentation initiated by:  Donn Pierini  Subjective/Objective Assessment:   Pt admitted with acute CVA     Action/Plan:   PTA pt lived at home, PT/OT evals   Anticipated DC Date:  03/11/2012   Anticipated DC Plan:  HOME W HOME HEALTH SERVICES      DC Planning Services  CM consult  Follow-up appt scheduled      Choice offered to / List presented to:  C-1 Patient   DME arranged  3-N-1  Dan Humphreys      DME agency  [facility]     [facility] arranged  HH-1 RN  HH-2 PT  HH-3 OT      [facility] agency  [facility]   Status of service:  Completed, signed off Medicare Important Message given?   (If response is "NO", the following Medicare IM given date fields will be blank) Date Medicare IM given:   Date Additional Medicare IM given:    Discharge Disposition:  HOME W HOME HEALTH SERVICES  Per UR Regulation:  Reviewed for med. necessity/level of care/duration of stay  If discussed at Long Length of Stay Meetings, dates discussed:    Comments:  PCP- Dala DockAndrey Campanile  03/11/12- 1100- Donn Pierini RN, BSN [phone] UR completed, pt for discharge today, orders for DME and [facility]- spoke with pt at bedside- pt has active orange card with [facility]- will call for f/u apointment- also has orders for [facility]- list of agencies reviewed with pt and pt wants to use [facility] for services and DME- referral placed in TLC and called to Derrian for DME with [facility] and Hilda Lias for [facility]. Call made to [facility] for f/u apointment- appointment scheduled for July 26 at 10:30 with Dr. Andrey Campanile.

## 2012-03-11 NOTE — Progress Notes (Signed)
Admission History: [name] is an 66 y.o. female with a history of hypertension, hyperlipidemia and diabetes mellitus who woke up around 7 am with numbness involving left side of her face and left hand today. She had no problems at bedtime 03/08/12 which was about midnight, except a right temporal headache. Has no previous history of stroke nor TIA. She's been taking aspirin 81 mg per day. CT scan of her head showed no signs of acute intracranial abnormality including no hemorrhage. Lumbar puncture was performed which showed no indications of subarachnoid hemorrhage. MRI showed a 7 mm acute infarction involving the right thalamus. NIH score was 1.   LSN: midnight 03/08/12 tPA Given: No: Beyond time under for treatment and minimal deficits.   Subjective: Patient up in chair at bedside. Dr. Isidoro Donning at bedside. Discussed previous intolerance to zocor, doing ok in hospital.  Objective: BP 136/69  Pulse 68  Temp 97.6 F (36.4 C) (Oral)  Resp 17  Ht 5' 4.5" (1.638 m)  Wt 94.4 kg (208 lb 1.8 oz)  BMI 35.17 kg/m2  SpO2 98%  CBGs Basename 03/11/12 0726 03/10/12 2132 03/10/12 1633 03/10/12 1201 03/10/12 0744 03/09/12 2157 03/09/12 1002  GLUCAP 108* 213* 199* 152* 151* 363* 170*   Diet: heart healthy, thin Activity: up ad lib with assist DVT Prophylaxis: lovenox  Medications: Scheduled:   .  stroke: mapping our early stages of recovery book   Does not apply Once  . sodium chloride   Intravenous STAT  . amLODipine  5 mg Oral Daily  . atorvastatin  20 mg Oral q1800  . cefTRIAXone (ROCEPHIN)  IV  1 g Intravenous Q24H  . clopidogrel  75 mg Oral Q breakfast  . enoxaparin (LOVENOX) injection  40 mg Subcutaneous Q24H  . insulin aspart  0-5 Units Subcutaneous QHS  . insulin aspart  0-9 Units Subcutaneous TID WC  . insulin glargine  45 Units Subcutaneous Daily  . irbesartan  150 mg Oral Daily  . levothyroxine  112 mcg Oral QAC breakfast  . omega-3 acid ethyl esters  1 g Oral Daily  . pantoprazole   40 mg Oral Q1200  . DISCONTD: aspirin  300 mg Rectal Daily  . DISCONTD: aspirin  325 mg Oral Daily  . DISCONTD: insulin glargine  42 Units Subcutaneous Daily   Neurologic Exam: Mental Status: Alert, oriented, thought content appropriate.  Speech fluent without evidence of aphasia. Able to follow 3 step commands without difficulty. Cranial Nerves: II- Visual fields grossly intact. III/IV/VI-Extraocular movements intact.  Pupils reactive bilaterally. V/VII-minimal left lower facial droop VIII-hearing grossly intact XI-bilateral shoulder shrug XII-midline tongue extension Motor: 5/5 bilaterally with normal tone and bulk, slightly weaker grip left compared to right Sensory: diminished left hemibody Deep Tendon Reflexes: 2+ and symmetric throughout Plantars: Downgoing bilaterally Cerebellar: Normal finger-to-nose, normal rapid alternating movements.  Lab Results: Basic Metabolic Panel: Lab 03/11/12 0530 03/09/12 2217 03/09/12 1155  NA 140 -- 137  K 3.6 -- 3.6  CL 106 -- 100  CO2 23 -- 26  GLUCOSE 124* -- 142*  BUN 14 -- 11  CREATININE 0.67 0.75 --  CALCIUM 8.8 -- 9.6  MG -- -- --  PHOS -- -- --   CBC: Lab 03/11/12 0530 03/09/12 2217 03/09/12 1155  WBC 11.5* 9.0 --  NEUTROABS -- -- 4.2  HGB 12.9 14.5 --  HCT 40.5 42.7 --  MCV 86.7 86.3 --  PLT 215 236 --   CBG: Lab 03/11/12 0726 03/10/12 2132 03/10/12 1633 03/10/12 1201 03/10/12  0744 03/09/12 2157  GLUCAP 108* 213* 199* 152* 151* 363*   Fasting Lipid Panel: Lab 03/10/12 0500  CHOL 225*  HDL 32*  LDLCALC 154*  TRIG 193*  CHOLHDL 7.0  LDLDIRECT --   Urinalysis: Lab 03/09/12 1401  COLORURINE YELLOW  LABSPEC 1.018  PHURINE 5.0  GLUCOSEU NEGATIVE  HGBUR NEGATIVE  BILIRUBINUR NEGATIVE  KETONESUR NEGATIVE  PROTEINUR NEGATIVE  UROBILINOGEN 0.2  NITRITE NEGATIVE  LEUKOCYTESUR SMALL*   Misc. Labs  Study Results:   03/09/2012 CT HEAD WITHOUT CONTRAST Findings: The brain has a normal appearance without evidence  for hemorrhage, infarction, hydrocephalus, or mass lesion.  There is no extra axial fluid collection.  The skull and paranasal sinuses are normal.  IMPRESSION: No acute intracranial abnormalities. Rosealee Albee, M.D.   03/09/2012   MRI HEAD WITHOUT AND WITH CONTRAST Findings:  There is a 7 mm acute infarction within the right thalamus.  No other acute infarction.  Elsewhere, there are minimal old small vessel changes within the hemispheric white matter.  No cortical or large vessel territory infarction.  No mass lesion, hemorrhage, hydrocephalus or extra- axial collection.  No pituitary mass.  No inflammatory sinus disease.  No skull or skull base lesion.  IMPRESSION:  7 mm acute infarction within the right thalamus.  Minimal small vessel changes elsewhere affecting the hemispheric white matter.  Thomasenia Sales, M.D.   03/09/2012  MRA HEAD WITHOUT CONTRAST   Findings: Both internal carotid arteries are widely patent into the brain.  No siphon stenosis.  The anterior and middle cerebral vessels are patent without proximal stenosis, aneurysm or vascular malformation.  Both vertebral arteries are patent with the left terminating in pica.  The right vertebral artery supplies the basilar.  No basilar stenosis.  Both posterior cerebral arteries take a fetal origin from the anterior circulation.  Therefore, the posterior circulation is somewhat diminutive.  IMPRESSION: No major vessel occlusion or correctable proximal stenosis. Diminutive posterior circulation based on the presence of fetal origin of the posterior cerebral arteries from the anterior circulation.  Thomasenia Sales, M.D.   03/10/2012 Carotid Doppler No internal carotid artery stenosis bilaterally. Vertebrals with antegrade flow bilaterally.   03/10/2012 2D echo EF 55-60% with no source of embolus.   Therapies: PT: home PT, OT: home OT  Assessment/Plan: 66 y.o. 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 Mild left  hemiparesis/ altered sensation. Stroke work up completed.  Was on ASA 81 mg at home.  LDL 154; not at goal LDL , 70  Stroke Risk Factors - diabetes mellitus, hyperlipidemia and hypertension   Plan:  - home PT &  OT - continue Plavix 75 mg per day  - continue lipitor 20 mg daily -Stroke Service will sign off. Follow up with Dr. Pearlean Brownie in 2 months.   LOS: 2 days   Annie Main, AVNP, ANP-BC, GNP-BC [facility] Pager: [phone] 03/11/2012 10:37 AM  Dr. Delia Heady, Stroke Center Medical Director, has personally reviewed chart, pertinent data, examined the patient and developed the plan of care. Pager:  [phone]

## 2012-03-11 NOTE — Progress Notes (Signed)
Pt received discharge instructions, including follow up appointments, new medications, risk factor reduction, when to call the doctor, when to call 911, signs and symptoms of stroke, signs and symptoms of heart attack, diet modification hand out, and exercise regimen. NIH was 0 at discharge. Pt has no further questions. Stable for DC. Has walker and bedside commode and is set up with advanced [facility] PT/OT/RN. Is aware of how to get a hold of them. Duwaine Maxin, RN

## 2012-03-11 NOTE — Progress Notes (Signed)
OT TREATMENT NOTE 0950  -0920 30 MIN   03/11/12 1000  OT Visit Information  Last OT Received On 03/11/12  OT Time Calculation  OT Start Time 0950  OT Stop Time 1025  OT Time Calculation (min) 35 min  Precautions  Precautions Fall  Exercises  Exercises Hand exercises;Hand activities;General Upper Extremity  Hand Exercises  Digit Composite Abduction AROM;Left;10 reps  Digit Composite Adduction AROM;Left;10 reps  Digit Lifts AROM;Left;10 reps  Opposition Left;10 reps;AROM  OT - End of Session  Equipment Utilized During Treatment Gait belt  Activity Tolerance Patient tolerated treatment well  Patient left in chair;with call bell/phone within reach  OT Assessment/Plan  Comments on Treatment Session Pt very motivated to participate in activities. Cues for slowing pace and controlling movement patterns. Completed theraputty and  HEP fine motor and coordination in addition to BUE integratyed activites. Pt given theraputty and written HEP. Needs HHOT.   OT Plan Discharge plan remains appropriate  OT Frequency Min 2X/week  Follow Up Recommendations Home health OT  Equipment Recommended Rolling walker with 5" wheels;3 in 1 bedside comode  Acute Rehab OT Goals  OT Goal Formulation With patient  Time For Goal Achievement 03/18/12  Potential to Achieve Goals Good  ADL Goals  Pt Will Perform Lower Body Bathing with modified independence;Sit to stand from chair  ADL Goal: Lower Body Bathing - Progress Progressing toward goals  Pt Will Perform Lower Body Dressing with modified independence;Sit to stand from chair  ADL Goal: Lower Body Dressing - Progress Progressing toward goals  Pt Will Perform Tub/Shower Transfer with modified independence;Other (comment)  ADL Goal: Tub/Shower Transfer - Progress Progressing toward goals  Arm Goals  Pt Will Complete Theraputty Exer Independently;to increase strength;Left upper extremity;Mod resistance putty  Arm Goal: Theraputty Exercises - Progress  Progressing toward goal  Additional Arm Goal #1 pt will be independent with fine motor/coordination HEP.  Arm Goal: Additional Goal #1 - Progress Progressing toward goals  OT General Charges  $OT Visit 1 Procedure  OT Treatments  $Neuromuscular Re-education 23-37 mins     [facility], OTR/L  [phone] 03/11/2012

## 2012-03-11 NOTE — Progress Notes (Signed)
Occupational Therapy Evaluation Patient Details Name: Aryanne Gilleland MRN: [id number] DOB: 01-Jul-1946 Today's Date: 03/11/2012 Time: 5284-1324 OT Time Calculation (min): 26 min  OT Assessment / Plan / Recommendation Clinical Impression  66 yo s/p R CVA with resulting L hemiparesis, coordination and sensory deficits. Pt will benefit from skilled OT services to max independence with ADL and functional mobility for ADL to facilitate D/C home independently with intermittent S of family/friends. Pt will need 3 in 1 and walker and HHOT.    OT Assessment  Patient needs continued OT Services    Follow Up Recommendations  Home health OT    Barriers to Discharge Decreased caregiver support    Equipment Recommendations  Rolling walker with 5" wheels;3 in 1 bedside comode    Recommendations for Other Services  none  Frequency  Min 2X/week    Precautions / Restrictions Precautions Precautions: Fall   Pertinent Vitals/Pain none    ADL  Eating/Feeding: Simulated;Independent Where Assessed - Eating/Feeding: Edge of bed Grooming: Simulated;Modified independent Where Assessed - Grooming: Supported standing Upper Body Bathing: Simulated;Modified independent Where Assessed - Upper Body Bathing: Unsupported sitting Lower Body Bathing: Simulated;Supervision/safety Where Assessed - Lower Body Bathing: Supported sit to stand Upper Body Dressing: Simulated;Modified independent Where Assessed - Upper Body Dressing: Unsupported sitting Lower Body Dressing: Simulated;Supervision/safety Where Assessed - Lower Body Dressing: Supported sit to stand Toilet Transfer: Buyer, retail Method: Sit to Barista: Comfort height toilet Toileting - Clothing Manipulation and Hygiene: Simulated;Modified independent Where Assessed - Toileting Clothing Manipulation and Hygiene: Standing Tub/Shower Transfer: Landscape architect Method:  Science writer: Walk in shower;Shower seat with back Equipment Used: Gait belt;Rolling walker Transfers/Ambulation Related to ADLs: supervision ADL Comments: cues to slow pace and increase awareness of LUE    OT Diagnosis: Generalized weakness;Apraxia  OT Problem List: Decreased strength;Impaired balance (sitting and/or standing);Decreased coordination;Impaired sensation;Impaired UE functional use OT Treatment Interventions: Self-care/ADL training;Therapeutic exercise;Neuromuscular education;Energy conservation;Therapeutic activities;Patient/family education;Balance training;DME and/or AE instruction   OT Goals Acute Rehab OT Goals OT Goal Formulation: With patient Time For Goal Achievement: 03/18/12 Potential to Achieve Goals: Good ADL Goals Pt Will Perform Lower Body Bathing: with modified independence;Sit to stand from chair ADL Goal: Lower Body Bathing - Progress: Goal set today Pt Will Perform Lower Body Dressing: with modified independence;Sit to stand from chair ADL Goal: Lower Body Dressing - Progress: Goal set today Pt Will Perform Tub/Shower Transfer: with modified independence;Other (comment) (with 3 in 1) ADL Goal: Tub/Shower Transfer - Progress: Goal set today Arm Goals Pt Will Complete Theraputty Exer: Independently;to increase strength;Left upper extremity;Mod resistance putty Arm Goal: Theraputty Exercises - Progress: Goal set today Additional Arm Goal #1: pt will be independent with fine motor/coordination HEP. Arm Goal: Additional Goal #1 - Progress: Goal set today  Visit Information  Last OT Received On: 03/11/12    Subjective Data  Subjective: My hand and face is still numb   Prior Functioning  Home Living Lives With: Alone Available Help at Discharge: Family;Friend(s);Available PRN/intermittently Type of Home: Apartment Home Access: Level entry Home Layout: One level Bathroom Shower/Tub: Engineer, manufacturing systems:  Standard Bathroom Accessibility: Yes How Accessible: Accessible via walker Home Adaptive Equipment: Other (comment) (built in shower seat) Prior Function Level of Independence: Independent Able to Take Stairs?: Yes Driving: Yes Vocation: Retired Musician: No difficulties Dominant Hand: Right    Cognition  Overall Cognitive Status: Appears within functional limits for tasks assessed/performed Arousal/Alertness: Awake/alert Orientation Level: Appears intact for tasks assessed Behavior  During Session: [facility] for tasks performed    Extremity/Trunk Assessment Right Upper Extremity Assessment RUE ROM/Strength/Tone: Within functional levels RUE Sensation: WFL - Light Touch RUE Coordination: WFL - gross/fine motor Left Upper Extremity Assessment LUE ROM/Strength/Tone: Deficits LUE ROM/Strength/Tone Deficits: Strength 4-/5  LUE Sensation: Deficits LUE Sensation Deficits: poor light touch. hand more affected than arm LUE Coordination: Deficits LUE Coordination Deficits: decrased FMC and GMC Trunk Assessment Trunk Assessment: Normal   Mobility Bed Mobility Bed Mobility: Supine to Sit;Sit to Supine Supine to Sit: 7: Independent Sit to Supine: 7: Independent;HOB flat Transfers Transfers: Sit to Stand;Stand to Sit Sit to Stand: 5: Supervision Stand to Sit: 5: Supervision   Exercise    Balance    End of Session OT - End of Session Equipment Utilized During Treatment: Gait belt Activity Tolerance: Patient tolerated treatment well Patient left: in chair;with call bell/phone within reach Nurse Communication: Other (comment) (need for HHOT)  GO     Louretta Tantillo,HILLARY 03/11/2012, 10:39 AM

## 2012-03-11 NOTE — Evaluation (Signed)
Speech Language Pathology Evaluation Patient Details Name: [name] MRN: [id number] DOB: [birth year] Today's Date: 03/11/2012 Time: 3086-5784 SLP Time Calculation (min): 8 min  Problem List:  Patient Active Problem List  Diagnosis  . HYPOTHYROIDISM  . HYPERLIPIDEMIA  . HYPERTENSION  . ALLERGIC RHINITIS  . ASTHMA  . GERD  . COUGH  . Diverticulitis of colon with abscess, s/p perc drain 7/26  . CVA (cerebral infarction)  . UTI (lower urinary tract infection)  . HTN (hypertension), malignant  . Diabetes mellitus   Past Medical History:  Past Medical History  Diagnosis Date  . Diabetes mellitus   . Sickle cell anemia   . Hyperlipidemia   . Thyroid disease   . Hypertension   . Ear infection   . Sinus problem    Past Surgical History:  Past Surgical History  Procedure Date  . Lipoma resection July 2012    8cm, 4cm Left neck, right upper back  . Abdominal hysterectomy    HPI:  66 yr old admitted with left finger and face numbness.  MRI revealed a right thalamic infarct.  PMH:  HTN, DM.   Assessment / Plan / Recommendation Clinical Impression  Pt.'s speech-language-cognitive function are WFL's.  Pt. without complaints in these areas prior or after the CVA.  No ST follow up needed at this time.    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  Overall Cognitive Status: Appears within functional limits for tasks assessed Orientation Level: Oriented X4    Comprehension  Auditory Comprehension Overall Auditory Comprehension: Appears within functional limits for tasks assessed Visual  Recognition/Discrimination Discrimination: Not tested Reading Comprehension Reading Status: Not tested (presently reading newspaper, denied difficulty)    Expression Expression Primary Mode of Expression: Verbal Verbal Expression Overall Verbal Expression: Appears within functional limits for tasks assessed Written Expression Dominant Hand: Right Written Expression: Not tested   Oral / Motor Oral Motor/Sensory Function Overall Oral Motor/Sensory Function: Appears within functional limits for tasks assessed Motor Speech Overall Motor Speech: Appears within functional limits for tasks assessed Motor Planning: Witnin functional limits        [name] [employer] [phone]  03/11/2012

## 2012-03-12 ENCOUNTER — Emergency Department (HOSPITAL_COMMUNITY): Payer: Self-pay

## 2012-03-12 ENCOUNTER — Emergency Department (HOSPITAL_COMMUNITY)
Admission: EM | Admit: 2012-03-12 | Discharge: 2012-03-12 | Disposition: A | Discharge status: Home / Self Care | Payer: Self-pay | Attending: Emergency Medicine | Admitting: Emergency Medicine

## 2012-03-12 DIAGNOSIS — E119 Type 2 diabetes mellitus without complications: Secondary | ICD-10-CM | POA: Insufficient documentation

## 2012-03-12 DIAGNOSIS — R51 Headache: Secondary | ICD-10-CM | POA: Insufficient documentation

## 2012-03-12 DIAGNOSIS — E785 Hyperlipidemia, unspecified: Secondary | ICD-10-CM | POA: Insufficient documentation

## 2012-03-12 DIAGNOSIS — I10 Essential (primary) hypertension: Secondary | ICD-10-CM | POA: Insufficient documentation

## 2012-03-12 DIAGNOSIS — Z79899 Other long term (current) drug therapy: Secondary | ICD-10-CM | POA: Insufficient documentation

## 2012-03-12 DIAGNOSIS — R209 Unspecified disturbances of skin sensation: Secondary | ICD-10-CM | POA: Insufficient documentation

## 2012-03-12 DIAGNOSIS — D571 Sickle-cell disease without crisis: Secondary | ICD-10-CM | POA: Insufficient documentation

## 2012-03-12 LAB — URINE CULTURE

## 2012-03-12 LAB — PROTIME-INR
INR: 1.01 (ref 0.00–1.49)
Prothrombin Time: 13.5 seconds (ref 11.6–15.2)

## 2012-03-12 LAB — BASIC METABOLIC PANEL
CO2: 27 mEq/L (ref 19–32)
Calcium: 9.5 mg/dL (ref 8.4–10.5)
Chloride: 99 mEq/L (ref 96–112)
Potassium: 3.8 mEq/L (ref 3.5–5.1)
Sodium: 136 mEq/L (ref 135–145)

## 2012-03-12 LAB — CBC
Platelets: 204 10*3/uL (ref 150–400)
RBC: 4.89 MIL/uL (ref 3.87–5.11)
WBC: 9.4 10*3/uL (ref 4.0–10.5)

## 2012-03-12 LAB — APTT: aPTT: 29 seconds (ref 24–37)

## 2012-03-12 MED ORDER — KETOROLAC TROMETHAMINE 30 MG/ML IJ SOLN
30.0000 mg | Freq: Once | INTRAMUSCULAR | Status: AC
  Administered 2012-03-12: 30 mg via INTRAVENOUS
  Filled 2012-03-12: qty 1

## 2012-03-12 MED ORDER — HYDROCODONE-ACETAMINOPHEN 5-325 MG PO TABS
2.0000 | ORAL_TABLET | Freq: Once | ORAL | Status: AC
  Administered 2012-03-12: 2 via ORAL
  Filled 2012-03-12: qty 2

## 2012-03-12 MED ORDER — DIPHENHYDRAMINE HCL 50 MG/ML IJ SOLN
25.0000 mg | Freq: Once | INTRAMUSCULAR | Status: AC
  Administered 2012-03-12: 25 mg via INTRAVENOUS
  Filled 2012-03-12: qty 1

## 2012-03-12 MED ORDER — METOCLOPRAMIDE HCL 5 MG/ML IJ SOLN
10.0000 mg | Freq: Once | INTRAMUSCULAR | Status: AC
  Administered 2012-03-12: 10 mg via INTRAVENOUS
  Filled 2012-03-12: qty 2

## 2012-03-12 NOTE — ED Notes (Signed)
Per EMS- pt woke with a headache this morning. Pt's caregiver told EMS that she was treated for a stroke last week. [facility] here today. Pt has been prescribed new BP medications since stroke but has not started taking them. No deficits noted with EMS

## 2012-03-12 NOTE — ED Provider Notes (Signed)
History     CSN: [id number]  Arrival date & time 03/12/12  1132   First MD Initiated Contact with Patient 03/12/12 1136      Chief Complaint  Patient presents with  . Hypertension    (Consider location/radiation/quality/duration/timing/severity/associated sxs/prior treatment) Patient is a 66 y.o. female presenting with headaches. The history is provided by the patient and medical records.  Headache  This is a recurrent problem. The current episode started 3 to 5 hours ago (~0730). The problem occurs constantly. The problem has not changed since onset.The headache is associated with an unknown factor. Pain location: diffuse. The quality of the pain is described as throbbing. The pain is severe. The pain does not radiate. Pertinent negatives include no fever, no malaise/fatigue, no near-syncope, no palpitations, no shortness of breath, no nausea and no vomiting. She has tried nothing for the symptoms.    Past Medical History  Diagnosis Date  . Diabetes mellitus   . Sickle cell anemia   . Hyperlipidemia   . Thyroid disease   . Hypertension   . Ear infection   . Sinus problem     Past Surgical History  Procedure Date  . Lipoma resection July 2012    8cm, 4cm Left neck, right upper back  . Abdominal hysterectomy     Family History  Problem Relation Age of Onset  . Heart disease Mother   . Hypertension Mother   . Diabetes Mother   . Heart disease Father   . Hypertension Father   . Diabetes Father   . Heart disease Sister   . Hypertension Sister   . Diabetes Sister   . Hypertension Brother   . Cancer Brother   . Diabetes Sister   . Hypertension Sister   . Diabetes Sister   . Hypertension Sister     History  Substance Use Topics  . Smoking status: Former Smoker    Quit date: 07/11/2005  . Smokeless tobacco: Never Used  . Alcohol Use: Yes     occasional    OB History    Grav Para Term Preterm Abortions TAB SAB Ect Mult Living                  Review of  Systems  Constitutional: Negative for fever, chills and malaise/fatigue.  HENT: Negative for congestion, rhinorrhea, neck pain and sinus pressure.   Eyes: Negative for photophobia and visual disturbance.  Respiratory: Negative for cough and shortness of breath.   Cardiovascular: Negative for chest pain, palpitations and near-syncope.  Gastrointestinal: Negative for nausea, vomiting and abdominal pain.  Musculoskeletal: Negative for back pain.  Skin: Negative for color change and rash.  Neurological: Positive for numbness (L sided since CVA 7/6; unchanged since then ) and headaches. Negative for dizziness, facial asymmetry, speech difficulty, weakness and light-headedness.  Psychiatric/Behavioral: Negative for confusion.  All other systems reviewed and are negative.    Allergies  Sulfonamide derivatives and Codeine  Home Medications   Current Outpatient Rx  Name Route Sig Dispense Refill  . AMLODIPINE BESYLATE 5 MG PO TABS Oral Take 5 mg by mouth daily.      . AMOXICILLIN-POT CLAVULANATE 875-125 MG PO TABS Oral Take 1 tablet by mouth 2 (two) times daily. x 10 days 20 tablet 0  . ATORVASTATIN CALCIUM 20 MG PO TABS Oral Take 1 tablet (20 mg total) by mouth daily. 90 tablet 3  . CLOPIDOGREL BISULFATE 75 MG PO TABS Oral Take 1 tablet (75 mg total) by mouth daily.  90 tablet 3  . OMEGA-3 FATTY ACIDS 1000 MG PO CAPS Oral Take 2 g by mouth daily.    Marland Kitchen GLIMEPIRIDE 4 MG PO TABS Oral Take 4 mg by mouth 2 (two) times daily.      . GUAIFENESIN 100 MG/5ML PO SOLN Oral Take 10 mLs (200 mg total) by mouth every 4 (four) hours as needed (cough, sore throat). 1200 mL 0  . HYDROCHLOROTHIAZIDE 25 MG PO TABS Oral Take 25 mg by mouth daily.      . INSULIN GLARGINE 100 UNIT/ML [place] SOLN Subcutaneous Inject 42 Units into the skin daily.     . IRBESARTAN 150 MG PO TABS Oral Take 150 mg by mouth daily.     Marland Kitchen LEVOTHYROXINE SODIUM 112 MCG PO TABS Oral Take 112 mcg by mouth daily.      Marland Kitchen LORATADINE 10 MG PO CAPS  Oral Take 10 mg by mouth daily as needed. For allergies    . LORAZEPAM 1 MG PO TABS Oral Take 1 tablet (1 mg total) by mouth 2 (two) times daily as needed for anxiety. 10 tablet 0  . OMEPRAZOLE 20 MG PO CPDR Oral Take 20 mg by mouth daily.      BP 131/78  Pulse 80  Temp 99 F (37.2 C) (Oral)  Resp 20  SpO2 97%  Physical Exam  Nursing note and vitals reviewed. Constitutional: She is oriented to person, place, and time. She appears well-developed and well-nourished.  HENT:  Head: Normocephalic and atraumatic.  Eyes: Pupils are equal, round, and reactive to light.  Cardiovascular: Normal rate, regular rhythm, normal heart sounds and intact distal pulses.   Pulmonary/Chest: Effort normal and breath sounds normal. No respiratory distress.  Abdominal: Soft. She exhibits no distension. There is no tenderness.  Neurological: She is alert and oriented to person, place, and time. She has normal strength. Coordination normal. GCS eye subscore is 4. GCS verbal subscore is 5. GCS motor subscore is 6.  Reflex Scores:      Bicep reflexes are 2+ on the right side and 2+ on the left side.      Patellar reflexes are 2+ on the right side and 2+ on the left side.      Mild decrease sensation L face, arm, leg compared to R  Skin: Skin is warm and dry.  Psychiatric: She has a normal mood and affect.    ED Course  Procedures (including critical care time)  Labs Reviewed - No data to display No results found.   Date: 03/12/2012  Rate: 77  Rhythm: normal sinus rhythm  QRS Axis: normal  Intervals: normal  ST/T Wave abnormalities: nonspecific T wave changes diffuse flattening  Conduction Disutrbances:none  Narrative Interpretation:   Old EKG Reviewed: unchanged 03/09/12   1. Headache       MDM  This is a 66 year old who presents today with a headache since this morning. The patient denies a history of frequent headaches, however since she had one several days ago at which time her blood  pressure was high, and she was experiencing some left sided numbness comment that time she was found to have a small non-infarct. Her home health nurse checked her blood pressure today, and found that it was elevated to the 170s/101. The nurse spoke with the physician managing her care, who advised that she be sent to the ED. The patient states she was discharged from the hospital yesterday, and has not yet filled her new blood pressure medications. The patient  states that she has residual left-sided numbness which has remained unchanged since her initial event, however denies any other neurodeficits. Upon arrival to the ED, the patient's blood pressure is normal, however her headache persists. [place] exam reveals mild left-sided sensory deficits, and is otherwise unremarkable. We'll treat the palpitations headache, and due to her recent ischemic stroke with recent elevation of her blood pressure, we'll get a CT scan of the head to evaluate for possible hemorrhagic conversion contributing to her symptoms, and we'll check basic labs.   Labs are unremarkable. A CT scan of her head does not show any acute abnormalities. The patient's blood pressure has remained stable. She has had very minimal improvement of her headache so far. Given this there are no signs of bleeding on the CT, will give patient Toradol as well as Norco to further treat her headache.  Patient with complete resolution of her headache at this point in time. Discussed with patient importance of feeling her blood pressure medications as they were prescribed, follow up with her regular doctor and indications for return. The patient expresses understanding of this plan.     Theotis Burrow, MD 03/12/12 1524

## 2012-03-12 NOTE — ED Notes (Signed)
Home health came to see the patient and noted that the patient's BP was elevated, and patient was complaining of a headache. Pt was discharged this past Monday after she had a stroke Saturday. Pt is A/A/Ox4, skin is warm and dry, respiration is even and unlabored.

## 2012-03-13 NOTE — ED Provider Notes (Signed)
I saw and evaluated the patient, reviewed the resident's note and I agree with the findings and plan.   Kendrea Cerritos, MD 03/13/12 0704 

## 2012-03-31 ENCOUNTER — Emergency Department (INDEPENDENT_AMBULATORY_CARE_PROVIDER_SITE_OTHER)
Admission: EM | Admit: 2012-03-31 | Discharge: 2012-03-31 | Disposition: A | Discharge status: Home / Self Care | Payer: Self-pay | Source: Home / Self Care | Attending: Emergency Medicine | Admitting: Emergency Medicine

## 2012-03-31 ENCOUNTER — Encounter (HOSPITAL_COMMUNITY): Payer: Self-pay | Admitting: Emergency Medicine

## 2012-03-31 DIAGNOSIS — E119 Type 2 diabetes mellitus without complications: Secondary | ICD-10-CM

## 2012-03-31 NOTE — ED Provider Notes (Signed)
History     CSN: [id number]  Arrival date & time 03/31/12  1131   First MD Initiated Contact with Patient 03/31/12 1141      Chief Complaint  Patient presents with  . Hyperglycemia    (Consider location/radiation/quality/duration/timing/severity/associated sxs/prior treatment) HPI Comments: Patient call her own coal provider as she detected a glucose level this morning before breakfast if 300s. She described that she spoke with a nurse over the phone that she was told to come to urgent care to be further evaluated. Patient denies any symptoms such as headaches, nausea vomiting abdominal pain polydipsia polyuria or fatigued. She was somewhat worried about her sugar being that high if she was switched to Lantus 2 days ago by her primary care Dr. which has increased her previous dose.   Past Medical History  Diagnosis Date  . Diabetes mellitus   . Sickle cell anemia   . Hyperlipidemia   . Thyroid disease   . Hypertension   . Ear infection   . Sinus problem     Past Surgical History  Procedure Date  . Lipoma resection July 2012    8cm, 4cm Left neck, right upper back  . Abdominal hysterectomy     Family History  Problem Relation Age of Onset  . Heart disease Mother   . Hypertension Mother   . Diabetes Mother   . Heart disease Father   . Hypertension Father   . Diabetes Father   . Heart disease Sister   . Hypertension Sister   . Diabetes Sister   . Hypertension Brother   . Cancer Brother   . Diabetes Sister   . Hypertension Sister   . Diabetes Sister   . Hypertension Sister     History  Substance Use Topics  . Smoking status: Former Smoker    Quit date: 07/11/2005  . Smokeless tobacco: Never Used  . Alcohol Use: Yes     occasional    OB History    Grav Para Term Preterm Abortions TAB SAB Ect Mult Living                  Review of Systems  Constitutional: Negative for fever, chills, activity change, appetite change and fatigue.  HENT: Negative for  hearing loss, ear pain, congestion, rhinorrhea, neck pain, postnasal drip, sinus pressure and tinnitus.   Respiratory: Negative for cough.   Cardiovascular: Negative for chest pain.  Gastrointestinal: Negative for nausea, vomiting, abdominal pain and constipation.  Genitourinary: Negative for dysuria.  Musculoskeletal: Negative for joint swelling and arthralgias.  Skin: Negative for color change.  Neurological: Negative for dizziness, light-headedness and headaches.    Allergies  Sulfonamide derivatives and Codeine  Home Medications   Current Outpatient Rx  Name Route Sig Dispense Refill  . AMLODIPINE BESYLATE 5 MG PO TABS Oral Take 5 mg by mouth daily.      Marland Kitchen GLIMEPIRIDE 4 MG PO TABS Oral Take 4 mg by mouth 2 (two) times daily.      Marland Kitchen HYDROCHLOROTHIAZIDE 25 MG PO TABS Oral Take 25 mg by mouth daily.      Marland Kitchen LEVOTHYROXINE SODIUM 112 MCG PO TABS Oral Take 112 mcg by mouth daily.      Marland Kitchen LORATADINE 10 MG PO CAPS Oral Take 10 mg by mouth daily as needed. For allergies    . OMEPRAZOLE 20 MG PO CPDR Oral Take 20 mg by mouth daily.    . ASPIRIN 81 MG PO TABS Oral Take 81 mg by mouth  daily.    . OMEGA-3 FATTY ACIDS 1000 MG PO CAPS Oral Take 2 g by mouth daily.    . INSULIN GLARGINE 100 UNIT/ML [place] SOLN Subcutaneous Inject 50 Units into the skin daily.     . IRBESARTAN 150 MG PO TABS Oral Take 150 mg by mouth daily.       BP 144/82  Pulse 84  Temp 97.9 F (36.6 C) (Oral)  Resp 20  SpO2 96%  Physical Exam  Nursing note and vitals reviewed. Constitutional: Vital signs are normal. She appears well-developed and well-nourished.  Non-toxic appearance. She does not have a sickly appearance. She does not appear ill. No distress.  HENT:  Head: Normocephalic.  Mouth/Throat: No oropharyngeal exudate.  Eyes: Conjunctivae are normal.  Neck: Neck supple.  Cardiovascular:  No murmur heard. Pulmonary/Chest: Effort normal and breath sounds normal.  Musculoskeletal: She exhibits no tenderness.    Neurological: She is alert.  Skin: No erythema.    ED Course  Procedures (including critical care time)  Labs Reviewed  GLUCOSE, CAPILLARY - Abnormal; Notable for the following:    Glucose-Capillary 242 (*)     All other components within normal limits   No results found.   1. Diabetes mellitus       MDM   Patient asymptomatic, with a capillary glucose in the 256. She was advised by a own coal nurse to come to urgent care to be further evaluated as she reported a a.m. glucose level in the 300s.(We gather from patient's described history). I explained to patient that this is not a critical level and we discussed symptoms that should warrant further evaluation. I encouraged her to discuss further her Lantus insulin management as she had several questions, she was seen by her primary care doctor 2 days ago and her Lantus was increased to 50 units per day. We also discussed some nutritional guidance and symptomatology of hyperglycemia. Patient was comfortable asymptomatic and acknowledge our recommendations.       Jimmie Molly, MD 03/31/12 1320

## 2012-03-31 NOTE — ED Notes (Signed)
Pt states her BS was in the 300's this am. States she has had difficulty controlling it since her "stroke" earlier this month. States she has had some left facial numbness since her stroke but today has numbness around her mouth. Saw Dr. Andrey Campanile at [facility] on Friday. Increased her Lantus to 50 units.

## 2012-04-04 ENCOUNTER — Ambulatory Visit: Payer: Self-pay | Attending: Family Medicine | Admitting: Physical Therapy

## 2012-04-04 DIAGNOSIS — M6281 Muscle weakness (generalized): Secondary | ICD-10-CM | POA: Insufficient documentation

## 2012-04-04 DIAGNOSIS — IMO0001 Reserved for inherently not codable concepts without codable children: Secondary | ICD-10-CM | POA: Insufficient documentation

## 2012-04-04 DIAGNOSIS — I69998 Other sequelae following unspecified cerebrovascular disease: Secondary | ICD-10-CM | POA: Insufficient documentation

## 2012-09-19 ENCOUNTER — Emergency Department (INDEPENDENT_AMBULATORY_CARE_PROVIDER_SITE_OTHER)
Admission: EM | Admit: 2012-09-19 | Discharge: 2012-09-19 | Disposition: A | Discharge status: Home / Self Care | Payer: No Typology Code available for payment source | Source: Home / Self Care | Attending: Family Medicine | Admitting: Family Medicine

## 2012-09-19 ENCOUNTER — Encounter (HOSPITAL_COMMUNITY): Payer: Self-pay

## 2012-09-19 DIAGNOSIS — Z760 Encounter for issue of repeat prescription: Secondary | ICD-10-CM

## 2012-09-19 DIAGNOSIS — I10 Essential (primary) hypertension: Secondary | ICD-10-CM

## 2012-09-19 LAB — GLUCOSE, CAPILLARY: Glucose-Capillary: 257 mg/dL — ABNORMAL HIGH (ref 70–99)

## 2012-09-19 MED ORDER — LEVOTHYROXINE SODIUM 112 MCG PO TABS: 112.0000 ug | ORAL_TABLET | Freq: Every day | ORAL | Status: DC

## 2012-09-19 MED ORDER — AMLODIPINE BESYLATE 5 MG PO TABS: 5.0000 mg | ORAL_TABLET | Freq: Every day | ORAL | Status: DC

## 2012-09-19 MED ORDER — OMEPRAZOLE 20 MG PO CPDR: 20.0000 mg | DELAYED_RELEASE_CAPSULE | Freq: Every day | ORAL | Status: DC

## 2012-09-19 MED ORDER — CLOPIDOGREL BISULFATE 75 MG PO TABS: 75.0000 mg | ORAL_TABLET | Freq: Every day | ORAL | Status: DC

## 2012-09-19 MED ORDER — HYDROCHLOROTHIAZIDE 25 MG PO TABS: 25.0000 mg | ORAL_TABLET | Freq: Every day | ORAL | Status: DC

## 2012-09-19 MED ORDER — GLIMEPIRIDE 4 MG PO TABS: 4.0000 mg | ORAL_TABLET | Freq: Two times a day (BID) | ORAL | Status: DC

## 2012-09-19 MED ORDER — INSULIN GLARGINE 100 UNIT/ML ~~LOC~~ SOLN: 50.0000 [IU] | Freq: Every day | SUBCUTANEOUS | Status: DC

## 2012-09-19 MED ORDER — PRAVASTATIN SODIUM 80 MG PO TABS: 80.0000 mg | ORAL_TABLET | Freq: Every day | ORAL | Status: DC

## 2012-09-19 MED ORDER — IRBESARTAN 150 MG PO TABS: 150.0000 mg | ORAL_TABLET | Freq: Every day | ORAL | Status: DC

## 2012-09-19 NOTE — ED Notes (Signed)
[facility] client- need medication refill has history of hypertension, DM

## 2012-09-19 NOTE — ED Provider Notes (Signed)
History     CSN: [id number]  Arrival date & time 09/19/12  1112   First MD Initiated Contact with Patient 09/19/12 1140      Chief Complaint  Patient presents with  . Medication Refill    (Consider location/radiation/quality/duration/timing/severity/associated sxs/prior treatment) HPI Comments: 67 year old female with history of diabetes, hypertension and hypothyroidism. Here requesting refills on her medications. Patient states that she has been stretching her last few pills of her thyroid and blood pressure medications during the last week. She's been skipping days to make her pills last period denies current shortness of breath or chest pain. No headache or dizziness. No visual changes or difficulty moving her extremities no gait abnormalities. No abdominal pain nausea or vomiting. She has been checking her sugars at home and it's been in the 300 range randomly she needs also refill her diabetes medications. Patient filled her medications at the counter pharmacy. Does not have a primary care provider currently. She was a [facility] patient and came today to establish primary care at the [facility] adult clinic but the clinic is closed today.   Past Medical History  Diagnosis Date  . Diabetes mellitus   . Sickle cell anemia   . Hyperlipidemia   . Thyroid disease   . Hypertension   . Ear infection   . Sinus problem     Past Surgical History  Procedure Date  . Lipoma resection July 2012    8cm, 4cm Left neck, right upper back  . Abdominal hysterectomy     Family History  Problem Relation Age of Onset  . Heart disease Mother   . Hypertension Mother   . Diabetes Mother   . Heart disease Father   . Hypertension Father   . Diabetes Father   . Heart disease Sister   . Hypertension Sister   . Diabetes Sister   . Hypertension Brother   . Cancer Brother   . Diabetes Sister   . Hypertension Sister   . Diabetes Sister   . Hypertension Sister     History  Substance Use  Topics  . Smoking status: Former Smoker    Quit date: 07/11/2005  . Smokeless tobacco: Never Used  . Alcohol Use: Yes     Comment: occasional    OB History    Grav Para Term Preterm Abortions TAB SAB Ect Mult Living                  Review of Systems  Constitutional: Negative for fever, chills, activity change and appetite change.  Eyes: Negative for visual disturbance.  Respiratory: Negative for cough and shortness of breath.   Cardiovascular: Negative for chest pain and leg swelling.  Gastrointestinal: Negative for nausea, vomiting, abdominal pain and diarrhea.  Skin: Negative for rash.  Neurological: Negative for dizziness and headaches.  All other systems reviewed and are negative.    Allergies  Sulfonamide derivatives and Codeine  Home Medications   Current Outpatient Rx  Name  Route  Sig  Dispense  Refill  . AMLODIPINE BESYLATE 5 MG PO TABS   Oral   Take 1 tablet (5 mg total) by mouth daily.   30 tablet   1   . CLOPIDOGREL BISULFATE 75 MG PO TABS   Oral   Take 1 tablet (75 mg total) by mouth daily.   30 tablet   1   . OMEGA-3 FATTY ACIDS 1000 MG PO CAPS   Oral   Take 2 g by mouth daily.         Marland Kitchen  GLIMEPIRIDE 4 MG PO TABS   Oral   Take 1 tablet (4 mg total) by mouth 2 (two) times daily.   30 tablet   1   . HYDROCHLOROTHIAZIDE 25 MG PO TABS   Oral   Take 1 tablet (25 mg total) by mouth daily.   30 tablet   1   . INSULIN GLARGINE 100 UNIT/ML [place] SOLN   Subcutaneous   Inject 50 Units into the skin daily.   10 mL   1   . IRBESARTAN 150 MG PO TABS   Oral   Take 1 tablet (150 mg total) by mouth daily.   30 tablet   1   . LEVOTHYROXINE SODIUM 112 MCG PO TABS   Oral   Take 1 tablet (112 mcg total) by mouth daily.   30 tablet   1   . LORATADINE 10 MG PO CAPS   Oral   Take 10 mg by mouth daily as needed. For allergies         . PRAVASTATIN SODIUM 80 MG PO TABS   Oral   Take 1 tablet (80 mg total) by mouth daily.   30 tablet   1      BP 150/75  Pulse 68  Temp 98.3 F (36.8 C) (Oral)  Resp 19  SpO2 99%  Physical Exam  Nursing note and vitals reviewed. Constitutional: She is oriented to person, place, and time. She appears well-developed and well-nourished. No distress.  HENT:  Head: Normocephalic and atraumatic.  Mouth/Throat: No oropharyngeal exudate.  Eyes: Conjunctivae normal are normal. No scleral icterus.  Neck: No JVD present.  Cardiovascular: Normal heart sounds.        No LEE  Pulmonary/Chest: Breath sounds normal.  Neurological: She is alert and oriented to person, place, and time.  Skin: She is not diaphoretic.       No foot ulcers, infection or wounds    ED Course  Procedures (including critical care time)  Labs Reviewed  GLUCOSE, CAPILLARY - Abnormal; Notable for the following:    Glucose-Capillary 257 (*)     All other components within normal limits   No results found.   1. Medication refill       MDM  67 year old female with history of diabetes hypertension hypothyroidism asymptomatic today random capillary blood sugar 257. Refill patient's medications and asked to restart aspirin as the prescribed. Asked to go to the emergency department if new symptoms. Patient was given information and was referred to establish primary care at the [facility] adult clinic.        Sharin Grave, MD 09/20/12 1156

## 2012-10-03 ENCOUNTER — Emergency Department (HOSPITAL_COMMUNITY)
Admission: EM | Admit: 2012-10-03 | Discharge: 2012-10-03 | Disposition: A | Discharge status: Home / Self Care | Payer: No Typology Code available for payment source | Source: Home / Self Care | Attending: Family Medicine | Admitting: Family Medicine

## 2012-10-03 ENCOUNTER — Encounter (HOSPITAL_COMMUNITY): Payer: Self-pay

## 2012-10-03 DIAGNOSIS — Z23 Encounter for immunization: Secondary | ICD-10-CM

## 2012-10-03 DIAGNOSIS — E039 Hypothyroidism, unspecified: Secondary | ICD-10-CM

## 2012-10-03 DIAGNOSIS — I6350 Cerebral infarction due to unspecified occlusion or stenosis of unspecified cerebral artery: Secondary | ICD-10-CM

## 2012-10-03 DIAGNOSIS — I639 Cerebral infarction, unspecified: Secondary | ICD-10-CM

## 2012-10-03 DIAGNOSIS — IMO0001 Reserved for inherently not codable concepts without codable children: Secondary | ICD-10-CM

## 2012-10-03 DIAGNOSIS — E785 Hyperlipidemia, unspecified: Secondary | ICD-10-CM

## 2012-10-03 DIAGNOSIS — J309 Allergic rhinitis, unspecified: Secondary | ICD-10-CM

## 2012-10-03 DIAGNOSIS — I10 Essential (primary) hypertension: Secondary | ICD-10-CM

## 2012-10-03 DIAGNOSIS — K219 Gastro-esophageal reflux disease without esophagitis: Secondary | ICD-10-CM

## 2012-10-03 DIAGNOSIS — E1165 Type 2 diabetes mellitus with hyperglycemia: Secondary | ICD-10-CM

## 2012-10-03 LAB — COMPREHENSIVE METABOLIC PANEL
ALT: 16 U/L (ref 0–35)
AST: 15 U/L (ref 0–37)
CO2: 28 mEq/L (ref 19–32)
Calcium: 10.1 mg/dL (ref 8.4–10.5)
GFR calc non Af Amer: >90 mL/min (ref >90)
Sodium: 138 mEq/L (ref 135–145)
Total Protein: 8.5 g/dL — ABNORMAL HIGH (ref 6.0–8.3)

## 2012-10-03 LAB — CBC
Hemoglobin: 15.2 g/dL — ABNORMAL HIGH (ref 12.0–15.0)
RBC: 5.35 MIL/uL — ABNORMAL HIGH (ref 3.87–5.11)

## 2012-10-03 LAB — GLUCOSE, CAPILLARY: Glucose-Capillary: 171 mg/dL — ABNORMAL HIGH (ref 70–99)

## 2012-10-03 MED ORDER — METFORMIN HCL ER 500 MG PO TB24: ORAL_TABLET | ORAL | Status: DC

## 2012-10-03 MED ORDER — GLIMEPIRIDE 4 MG PO TABS: 4.0000 mg | ORAL_TABLET | Freq: Every day | ORAL | Status: DC

## 2012-10-03 MED ORDER — INSULIN GLARGINE 100 UNIT/ML ~~LOC~~ SOLN: 50.0000 [IU] | Freq: Every day | SUBCUTANEOUS | Status: DC

## 2012-10-03 MED ORDER — HYDROCHLOROTHIAZIDE 25 MG PO TABS: 25.0000 mg | ORAL_TABLET | Freq: Every day | ORAL | Status: AC

## 2012-10-03 MED ORDER — CLOPIDOGREL BISULFATE 75 MG PO TABS: 75.0000 mg | ORAL_TABLET | Freq: Every day | ORAL | Status: AC

## 2012-10-03 MED ORDER — LEVOTHYROXINE SODIUM 112 MCG PO TABS: 112.0000 ug | ORAL_TABLET | Freq: Every day | ORAL | Status: DC

## 2012-10-03 MED ORDER — AMLODIPINE BESYLATE 10 MG PO TABS: 10.0000 mg | ORAL_TABLET | Freq: Every day | ORAL | Status: AC

## 2012-10-03 MED ORDER — PRAVASTATIN SODIUM 80 MG PO TABS: 80.0000 mg | ORAL_TABLET | Freq: Every day | ORAL | Status: DC

## 2012-10-03 MED ORDER — INFLUENZA VIRUS VACC SPLIT PF IM SUSP
0.5000 mL | Freq: Once | INTRAMUSCULAR | Status: AC
  Administered 2012-10-03: 0.5 mL via INTRAMUSCULAR

## 2012-10-03 MED ORDER — IRBESARTAN 150 MG PO TABS: 150.0000 mg | ORAL_TABLET | Freq: Every day | ORAL | Status: DC

## 2012-10-03 NOTE — ED Notes (Signed)
Former [facility] client history of DM. HTN asthma , high cholesterol, chf sickle cell trait and strok

## 2012-10-03 NOTE — Discharge Instructions (Signed)
Allergic Rhinitis Allergic rhinitis is when the mucous membranes in the nose respond to allergens. Allergens are particles in the air that cause your body to have an allergic reaction. This causes you to release allergic antibodies. Through a chain of events, these eventually cause you to release histamine into the blood stream (hence the use of antihistamines). Although meant to be protective to the body, it is this release that causes your discomfort, such as frequent sneezing, congestion and an itchy runny nose.  CAUSES  The pollen allergens may come from grasses, trees, and weeds. This is seasonal allergic rhinitis, or "hay fever." Other allergens cause year-round allergic rhinitis (perennial allergic rhinitis) such as house dust mite allergen, pet dander and mold spores.  SYMPTOMS   Nasal stuffiness (congestion).  Runny, itchy nose with sneezing and tearing of the eyes.  There is often an itching of the mouth, eyes and ears. It cannot be cured, but it can be controlled with medications. DIAGNOSIS  If you are unable to determine the offending allergen, skin or blood testing may find it. TREATMENT   Avoid the allergen.  Medications and allergy shots (immunotherapy) can help.  Hay fever may often be treated with antihistamines in pill or nasal spray forms. Antihistamines block the effects of histamine. There are over-the-counter medicines that may help with nasal congestion and swelling around the eyes. Check with your caregiver before taking or giving this medicine. If the treatment above does not work, there are many new medications your caregiver can prescribe. Stronger medications may be used if initial measures are ineffective. Desensitizing injections can be used if medications and avoidance fails. Desensitization is when a patient is given ongoing shots until the body becomes less sensitive to the allergen. Make sure you follow up with your caregiver if problems continue. SEEK MEDICAL  CARE IF:   You develop fever (more than 100.5 F (38.1 C).  You develop a cough that does not stop easily (persistent).  You have shortness of breath.  You start wheezing.  Symptoms interfere with normal daily activities. Document Released: 05/16/2001 Document Revised: 11/13/2011 Document Reviewed: 11/25/2008 [facility] Patient Information 2013 [place], [place]. Arterial Hypertension Arterial hypertension (high blood pressure) is a condition of elevated pressure in your blood vessels. Hypertension over a long period of time is a risk factor for strokes, heart attacks, and heart failure. It is also the leading cause of kidney (renal) failure.  CAUSES   In Adults -- Over 90% of all hypertension has no known cause. This is called essential or primary hypertension. In the other 10% of people with hypertension, the increase in blood pressure is caused by another disorder. This is called secondary hypertension. Important causes of secondary hypertension are:  Heavy alcohol use.  Obstructive sleep apnea.  Hyperaldosterosim (Conn's syndrome).  Steroid use.  Chronic kidney failure.  Hyperparathyroidism.  Medications.  Renal artery stenosis.  Pheochromocytoma.  Cushing's disease.  Coarctation of the aorta.  Scleroderma renal crisis.  Licorice (in excessive amounts).  Drugs (cocaine, methamphetamine). Your caregiver can explain any items above that apply to you.  In Children -- Secondary hypertension is more common and should always be considered.  Pregnancy -- Few women of childbearing age have high blood pressure. However, up to 10% of them develop hypertension of pregnancy. Generally, this will not harm the woman. It may be a sign of 3 complications of pregnancy: preeclampsia, HELLP syndrome, and eclampsia. Follow up and control with medication is necessary. SYMPTOMS   This condition normally does not  produce any noticeable symptoms. It is usually found during a routine  exam.  Malignant hypertension is a late problem of high blood pressure. It may have the following symptoms:  Headaches.  Blurred vision.  End-organ damage (this means your kidneys, heart, lungs, and other organs are being damaged).  Stressful situations can increase the blood pressure. If a person with normal blood pressure has their blood pressure go up while being seen by their caregiver, this is often termed "white coat hypertension." Its importance is not known. It may be related with eventually developing hypertension or complications of hypertension.  Hypertension is often confused with mental tension, stress, and anxiety. DIAGNOSIS  The diagnosis is made by 3 separate blood pressure measurements. They are taken at least 1 week apart from each other. If there is organ damage from hypertension, the diagnosis may be made without repeat measurements. Hypertension is usually identified by having blood pressure readings:  Above 140/90 mmHg measured in both arms, at 3 separate times, over a couple weeks.  Over 130/80 mmHg should be considered a risk factor and may require treatment in patients with diabetes. Blood pressure readings over 120/80 mmHg are called "pre-hypertension" even in non-diabetic patients. To get a true blood pressure measurement, use the following guidelines. Be aware of the factors that can alter blood pressure readings.  Take measurements at least 1 hour after caffeine.  Take measurements 30 minutes after smoking and without any stress. This is another reason to quit smoking  it raises your blood pressure.  Use a proper cuff size. Ask your caregiver if you are not sure about your cuff size.  Most home blood pressure cuffs are automatic. They will measure systolic and diastolic pressures. The systolic pressure is the pressure reading at the start of sounds. Diastolic pressure is the pressure at which the sounds disappear. If you are elderly, measure pressures in  multiple postures. Try sitting, lying or standing.  Sit at rest for a minimum of 5 minutes before taking measurements.  You should not be on any medications like decongestants. These are found in many cold medications.  Record your blood pressure readings and review them with your caregiver. If you have hypertension:  Your caregiver may do tests to be sure you do not have secondary hypertension (see "causes" above).  Your caregiver may also look for signs of metabolic syndrome. This is also called Syndrome X or Insulin Resistance Syndrome. You may have this syndrome if you have type 2 diabetes, abdominal obesity, and abnormal blood lipids in addition to hypertension.  Your caregiver will take your medical and family history and perform a physical exam.  Diagnostic tests may include blood tests (for glucose, cholesterol, potassium, and kidney function), a urinalysis, or an EKG. Other tests may also be necessary depending on your condition. PREVENTION  There are important lifestyle issues that you can adopt to reduce your chance of developing hypertension:  Maintain a normal weight.  Limit the amount of salt (sodium) in your diet.  Exercise often.  Limit alcohol intake.  Get enough potassium in your diet. Discuss specific advice with your caregiver.  Follow a DASH diet (dietary approaches to stop hypertension). This diet is rich in fruits, vegetables, and low-fat dairy products, and avoids certain fats. PROGNOSIS  Essential hypertension cannot be cured. Lifestyle changes and medical treatment can lower blood pressure and reduce complications. The prognosis of secondary hypertension depends on the underlying cause. Many people whose hypertension is controlled with medicine or lifestyle changes can  live a normal, healthy life.  RISKS AND COMPLICATIONS  While high blood pressure alone is not an illness, it often requires treatment due to its short- and long-term effects on many organs.  Hypertension increases your risk for:  CVAs or strokes (cerebrovascular accident).  Heart failure due to chronically high blood pressure (hypertensive cardiomyopathy).  Heart attack (myocardial infarction).  Damage to the retina (hypertensive retinopathy).  Kidney failure (hypertensive nephropathy). Your caregiver can explain list items above that apply to you. Treatment of hypertension can significantly reduce the risk of complications. TREATMENT   For overweight patients, weight loss and regular exercise are recommended. Physical fitness lowers blood pressure.  Mild hypertension is usually treated with diet and exercise. A diet rich in fruits and vegetables, fat-free dairy products, and foods low in fat and salt (sodium) can help lower blood pressure. Decreasing salt intake decreases blood pressure in a 1/3 of people.  Stop smoking if you are a smoker. The steps above are highly effective in reducing blood pressure. While these actions are easy to suggest, they are difficult to achieve. Most patients with moderate or severe hypertension end up requiring medications to bring their blood pressure down to a normal level. There are several classes of medications for treatment. Blood pressure pills (antihypertensives) will lower blood pressure by their different actions. Lowering the blood pressure by 10 mmHg may decrease the risk of complications by as much as 25%. The goal of treatment is effective blood pressure control. This will reduce your risk for complications. Your caregiver will help you determine the best treatment for you according to your lifestyle. What is excellent treatment for one person, may not be for you. HOME CARE INSTRUCTIONS   Do not smoke.  Follow the lifestyle changes outlined in the "Prevention" section.  If you are on medications, follow the directions carefully. Blood pressure medications must be taken as prescribed. Skipping doses reduces their benefit. It also  puts you at risk for problems.  Follow up with your caregiver, as directed.  If you are asked to monitor your blood pressure at home, follow the guidelines in the "Diagnosis" section above. SEEK MEDICAL CARE IF:   You think you are having medication side effects.  You have recurrent headaches or lightheadedness.  You have swelling in your ankles.  You have trouble with your vision. SEEK IMMEDIATE MEDICAL CARE IF:   You have sudden onset of chest pain or pressure, difficulty breathing, or other symptoms of a heart attack.  You have a severe headache.  You have symptoms of a stroke (such as sudden weakness, difficulty speaking, difficulty walking). MAKE SURE YOU:   Understand these instructions.  Will watch your condition.  Will get help right away if you are not doing well or get worse. Document Released: 08/21/2005 Document Revised: 11/13/2011 Document Reviewed: 03/21/2007 [facility] Patient Information 2013 [place], [place]. Cholesterol Cholesterol is a type of fat. Your body needs a small amount of cholesterol, but too much can cause health problems. Certain problems include heart attacks, strokes, and not enough blood flow to your heart, brain, kidneys, or feet. You get cholesterol in 2 ways:  Naturally.  By eating certain foods. HOME CARE  Eat a low-fat diet:  Eat less eggs, whole dairy products (whole milk, cheese, and butter), fatty meats, and fried foods.  Eat more fruits, vegetables, whole-wheat breads, lean chicken, and fish.  Follow your exercise program as told by your doctor.  Keep your weight at a healthy level. Talk to your doctor  about what is right for you.  Only take medicine as told by your doctor.  Get your cholesterol checked once a year or as told by your doctor. MAKE SURE YOU:  Understand these instructions.  Will watch your condition.  Will get help right away if you are not doing well or get worse. Document Released: 11/17/2008 Document  Revised: 11/13/2011 Document Reviewed: 11/17/2008 [facility] Patient Information 2013 [place], [place]. Blood Sugar Monitoring, Adult GLUCOSE METERS FOR SELF-MONITORING OF BLOOD GLUCOSE  It is important to be able to correctly measure your blood sugar (glucose). You can use a blood glucose monitor (a small battery-operated device) to check your glucose level at any time. This allows you and your caregiver to monitor your diabetes and to determine how well your treatment plan is working. The process of monitoring your blood glucose with a glucose meter is called self-monitoring of blood glucose (SMBG). When people with diabetes control their blood sugar, they have better health. To test for glucose with a typical glucose meter, place the disposable strip in the meter. Then place a small sample of blood on the "test strip." The test strip is coated with chemicals that combine with glucose in blood. The meter measures how much glucose is present. The meter displays the glucose level as a number. Several new models can record and store a number of test results. Some models can connect to personal computers to store test results or print them out.  Newer meters are often easier to use than older models. Some meters allow you to get blood from places other than your fingertip. Some new models have automatic timing, error codes, signals, or barcode readers to help with proper adjustment (calibration). Some meters have a large display screen or spoken instructions for people with visual impairments.  INSTRUCTIONS FOR USING GLUCOSE METERS  Wash your hands with soap and warm water, or clean the area with alcohol. Dry your hands completely.  Prick the side of your fingertip with a lancet (a sharp-pointed tool used by hand).  Hold the hand down and gently milk the finger until a small drop of blood appears. Catch the blood with the test strip.  Follow the instructions for inserting the test strip and using the SMBG  meter. Most meters require the meter to be turned on and the test strip to be inserted before applying the blood sample.  Record the test result.  Read the instructions carefully for both the meter and the test strips that go with it. Meter instructions are found in the user manual. Keep this manual to help you solve any problems that may arise. Many meters use "error codes" when there is a problem with the meter, the test strip, or the blood sample on the strip. You will need the manual to understand these error codes and fix the problem.  New devices are available such as laser lancets and meters that can test blood taken from "alternative sites" of the body, other than fingertips. However, you should use standard fingertip testing if your glucose changes rapidly. Also, use standard testing if:  You have eaten, exercised, or taken insulin in the past 2 hours.  You think your glucose is low.  You tend to not feel symptoms of low blood glucose (hypoglycemia).  You are ill or under stress.  Clean the meter as directed by the manufacturer.  Test the meter for accuracy as directed by the manufacturer.  Take your meter with you to your caregiver's office. This way,  you can test your glucose in front of your caregiver to make sure you are using the meter correctly. Your caregiver can also take a sample of blood to test using a routine lab method. If values on the glucose meter are close to the lab results, you and your caregiver will see that your meter is working well and you are using good technique. Your caregiver will advise you about what to do if the results do not match. FREQUENCY OF TESTING  Your caregiver will tell you how often you should check your blood glucose. This will depend on your type of diabetes, your current level of diabetes control, and your types of medicines. The following are general guidelines, but your care plan may be different. Record all your readings and the time of  day you took them for review with your caregiver.   Diabetes type 1.  When you are using insulin with good diabetic control (either multiple daily injections or via a pump), you should check your glucose 4 times a day.  If your diabetes is not well controlled, you may need to monitor more frequently, including before meals and 2 hours after meals, at bedtime, and occasionally between 2 a.m. and 3 a.m.  You should always check your glucose before a dose of insulin or before changing the rate on your insulin pump.  Diabetes type 2.  Guidelines for SMBG in diabetes type 2 are not as well defined.  If you are on insulin, follow the guidelines above.  If you are on medicines, but not insulin, and your glucose is not well controlled, you should test at least twice daily.  If you are not on insulin, and your diabetes is controlled with medicines or diet alone, you should test at least once daily, usually before breakfast.  A weekly profile will help your caregiver advise you on your care plan. The week before your visit, check your glucose before a meal and 2 hours after a meal at least daily. You may want to test before and after a different meal each day so you and your caregiver can tell how well controlled your blood sugars are throughout the course of a 24 hour period.  Gestational diabetes (diabetes during pregnancy).  Frequent testing is often necessary. Accurate timing is important.  If you are not on insulin, check your glucose 4 times a day. Check it before breakfast and 1 hour after the start of each meal.  If you are on insulin, check your glucose 6 times a day. Check it before each meal and 1 hour after the first bite of each meal.  General guidelines.  More frequent testing is required at the start of insulin treatment. Your caregiver will instruct you.  Test your glucose any time you suspect you have low blood sugar (hypoglycemia).  You should test more often when you  change medicines, when you have unusual stress or illness, or in other unusual circumstances. OTHER THINGS TO KNOW ABOUT GLUCOSE METERS  Measurement Range. Most glucose meters are able to read glucose levels over a broad range of values from as low as 0 to as high as 600 mg/dL. If you get an extremely high or low reading from your meter, you should first confirm it with another reading. Report very high or very low readings to your caregiver.  Whole Blood Glucose versus Plasma Glucose. Some older home glucose meters measure glucose in your whole blood. In a lab or when using some newer home glucose  meters, the glucose is measured in your plasma (one component of blood). The difference can be important. It is important for you and your caregiver to know whether your meter gives its results as "whole blood equivalent" or "plasma equivalent."  Display of High and Low Glucose Values. Part of learning how to operate a meter is understanding what the meter results mean. Know how high and low glucose concentrations are displayed on your meter.  Factors that Affect Glucose Meter Performance. The accuracy of your test results depends on many factors and varies depending on the brand and type of meter. These factors include:  Low red blood cell count (anemia).  Substances in your blood (such as uric acid, vitamin C, and others).  Environmental factors (temperature, humidity, altitude).  Name-brand versus generic test strips.  Calibration. Make sure your meter is set up properly. It is a good idea to do a calibration test with a control solution recommended by the manufacturer of your meter whenever you begin using a fresh bottle of test strips. This will help verify the accuracy of your meter.  Improperly stored, expired, or defective test strips. Keep your strips in a dry place with the lid on.  Soiled meter.  Inadequate blood sample. NEW TECHNOLOGIES FOR GLUCOSE TESTING Alternative site  testing Some glucose meters allow testing blood from alternative sites. These include the:  Upper arm.  Forearm.  Base of the thumb.  Thigh. Sampling blood from alternative sites may be desirable. However, it may have some limitations. Blood in the fingertips show changes in glucose levels more quickly than blood in other parts of the body. This means that alternative site test results may be different from fingertip test results, not because of the meter's ability to test accurately, but because the actual glucose concentration can be different.  Continuous Glucose Monitoring Devices to measure your blood glucose continuously are available, and others are in development. These methods can be more expensive than self-monitoring with a glucose meter. However, it is uncertain how effective and reliable these devices are. Your caregiver will advise you if this approach makes sense for you. IF BLOOD SUGARS ARE CONTROLLED, PEOPLE WITH DIABETES REMAIN HEALTHIER.  SMBG is an important part of the treatment plan of patients with diabetes mellitus. Below are reasons for using SMBG:   It confirms that your glucose is at a specific, healthy level.  It detects hypoglycemia and severe hyperglycemia.  It allows you and your caregiver to make adjustments in response to changes in lifestyle for individuals requiring medicine.  It determines the need for starting insulin therapy in temporary diabetes that happens during pregnancy (gestational diabetes). Document Released: 08/24/2003 Document Revised: 11/13/2011 Document Reviewed: 12/15/2010 [facility] Patient Information 2013 [place], [place]. DASH Diet The DASH diet stands for "Dietary Approaches to Stop Hypertension." It is a healthy eating plan that has been shown to reduce high blood pressure (hypertension) in as little as 14 days, while also possibly providing other significant health benefits. These other health benefits include reducing the risk of  breast cancer after menopause and reducing the risk of type 2 diabetes, heart disease, colon cancer, and stroke. Health benefits also include weight loss and slowing kidney failure in patients with chronic kidney disease.  DIET GUIDELINES  Limit salt (sodium). Your diet should contain less than 1500 mg of sodium daily.  Limit refined or processed carbohydrates. Your diet should include mostly whole grains. Desserts and added sugars should be used sparingly.  Include small amounts of heart-healthy fats.  These types of fats include nuts, oils, and tub margarine. Limit saturated and trans fats. These fats have been shown to be harmful in the body. CHOOSING FOODS  The following food groups are based on a 2000 calorie diet. See your Registered Dietitian for individual calorie needs. Grains and Grain Products (6 to 8 servings daily)  Eat More Often: Whole-wheat bread, brown rice, whole-grain or wheat pasta, quinoa, popcorn without added fat or salt (air popped).  Eat Less Often: White bread, white pasta, white rice, cornbread. Vegetables (4 to 5 servings daily)  Eat More Often: Fresh, frozen, and canned vegetables. Vegetables may be raw, steamed, roasted, or grilled with a minimal amount of fat.  Eat Less Often/Avoid: Creamed or fried vegetables. Vegetables in a cheese sauce. Fruit (4 to 5 servings daily)  Eat More Often: All fresh, canned (in natural juice), or frozen fruits. Dried fruits without added sugar. One hundred percent fruit juice ( cup [237 mL] daily).  Eat Less Often: Dried fruits with added sugar. Canned fruit in light or heavy syrup. [employer], Fish, and Poultry (2 servings or less daily. One serving is 3 to 4 oz [85-114 g]).  Eat More Often: Ninety percent or leaner ground beef, tenderloin, sirloin. Round cuts of beef, chicken breast, [place] breast. All fish. Grill, bake, or broil your meat. Nothing should be fried.  Eat Less Often/Avoid: Fatty cuts of meat, [place], or  chicken leg, thigh, or wing. Fried cuts of meat or fish. Dairy (2 to 3 servings)  Eat More Often: Low-fat or fat-free milk, low-fat plain or light yogurt, reduced-fat or part-skim cheese.  Eat Less Often/Avoid: Milk (whole, 2%).Whole milk yogurt. Full-fat cheeses. Nuts, Seeds, and Legumes (4 to 5 servings per week)  Eat More Often: All without added salt.  Eat Less Often/Avoid: Salted nuts and seeds, canned beans with added salt. Fats and Sweets (limited)  Eat More Often: Vegetable oils, tub margarines without trans fats, sugar-free gelatin. Mayonnaise and salad dressings.  Eat Less Often/Avoid: Coconut oils, palm oils, butter, stick margarine, cream, half and half, cookies, candy, pie. FOR MORE INFORMATION The Dash Diet Eating Plan: [email or web address] Document Released: 08/10/2011 Document Revised: 11/13/2011 Document Reviewed: 08/10/2011 [facility] Patient Information 2013 [place], [place]. Diabetes and Foot Care Diabetes may cause you to have a poor blood supply (circulation) to your legs and feet. Because of this, the skin may be thinner, break easier, and heal more slowly. You also may have nerve damage in your legs and feet causing decreased feeling. You may not notice minor injuries to your feet that could lead to serious problems or infections. Taking care of your feet is one of the most important things you can do for yourself.  HOME CARE INSTRUCTIONS  Do not go barefoot. Bare feet are easily injured.  Check your feet daily for blisters, cuts, and redness.  Wash your feet with warm water (not hot) and mild soap. Pat your feet and between your toes until completely dry.  Apply a moisturizing lotion that does not contain alcohol or petroleum jelly to the dry skin on your feet and to dry brittle toenails. Do not put it between your toes.  Trim your toenails straight across. Do not dig under them or around the cuticle.  Do not cut corns or calluses, or try to remove them with  medicine.  Wear clean cotton socks or stockings every day. Make sure they are not too tight. Do not wear knee high stockings since they may decrease blood flow to  your legs.  Wear leather shoes that fit properly and have enough cushioning. To break in new shoes, wear them just a few hours a day to avoid injuring your feet.  Wear shoes at all times, even in the house.  Do not cross your legs. This may decrease the blood flow to your feet.  If you find a minor scrape, cut, or break in the skin on your feet, keep it and the skin around it clean and dry. These areas may be cleansed with mild soap and water. Do not use peroxide, alcohol, iodine or Merthiolate.  When you remove an adhesive bandage, be sure not to harm the skin around it.  If you have a wound, look at it several times a day to make sure it is healing.  Do not use heating pads or hot water bottles. Burns can occur. If you have lost feeling in your feet or legs, you may not know it is happening until it is too late.  Report any cuts, sores or bruises to your caregiver. Do not wait! SEEK MEDICAL CARE IF:   You have an injury that is not healing or you notice redness, numbness, burning, or tingling.  Your feet always feel cold.  You have pain or cramps in your legs and feet. SEEK IMMEDIATE MEDICAL CARE IF:   There is increasing redness, swelling, or increasing pain in the wound.  There is a red line that goes up your leg.  Pus is coming from a wound.  You develop an unexplained oral temperature above 102 F (38.9 C), or as your caregiver suggests.  You notice a bad smell coming from an ulcer or wound. MAKE SURE YOU:   Understand these instructions.  Will watch your condition.  Will get help right away if you are not doing well or get worse. Document Released: 08/18/2000 Document Revised: 11/13/2011 Document Reviewed: 02/24/2009 [facility] Patient Information 2013 [place], [place]. Type 2 Diabetes Diabetes is a  long-lasting (chronic) disease. One or both of the following happen with type 2 diabetes:   The pancreas does not make enough of a hormone called insulin.  The body has trouble using the insulin that is made. HOME CARE  Check your blood sugar (glucose) once a day, or as told by your doctor.  Take all medicine as told by your doctor.  Do not smoke.  Eat healthy foods. Weight loss can help your diabetes.  Learn about low blood sugar (hypoglycemia). Know how to treat it.  Get your eyes checked on a regular basis.  Get a physical exam every year. Get your blood pressure checked. Get your blood and pee (urine) tested.  Wear a necklace or bracelet that says you have diabetes.  Check your feet every night for cuts, sores, blisters, and redness. Tell your doctor if you have problems. GET HELP RIGHT AWAY IF:  You have trouble keeping your blood sugar in target range.  You have problems with your medicines.  You are sick and not getting better after 24 hours.  You have a sore or wound that is not healing.  You have vision problems or changes.  You have a fever. MAKE SURE YOU:  Understand these instructions.  Will watch your condition.  Will get help right away if you are not doing well or get worse. Document Released: 05/30/2008 Document Revised: 11/13/2011 Document Reviewed: 02/06/2011 [facility] Patient Information 2013 [place], [place]. Diet for Gastroesophageal Reflux Disease, Adult Reflux (acid reflux) is when acid from your stomach flows  up into the esophagus. When acid comes in contact with the esophagus, the acid causes irritation and soreness (inflammation) in the esophagus. When reflux happens often or so severely that it causes damage to the esophagus, it is called gastroesophageal reflux disease (GERD). Nutrition therapy can help ease the discomfort of GERD. FOODS OR DRINKS TO AVOID OR LIMIT  Smoking or chewing tobacco. Nicotine is one of the most potent stimulants to  acid production in the gastrointestinal tract.  Caffeinated and decaffeinated coffee and black tea.  Regular or low-calorie carbonated beverages or energy drinks (caffeine-free carbonated beverages are allowed).   Strong spices, such as black pepper, white pepper, red pepper, cayenne, curry powder, and chili powder.  Peppermint or spearmint.  Chocolate.  High-fat foods, including meats and fried foods. Extra added fats including oils, butter, salad dressings, and nuts. Limit these to less than 8 tsp per day.  Fruits and vegetables if they are not tolerated, such as citrus fruits or tomatoes.  Alcohol.  Any food that seems to aggravate your condition. If you have questions regarding your diet, call your caregiver or a registered dietitian. OTHER THINGS THAT MAY HELP GERD INCLUDE:   Eating your meals slowly, in a relaxed setting.  Eating 5 to 6 small meals per day instead of 3 large meals.  Eliminating food for a period of time if it causes distress.  Not lying down until 3 hours after eating a meal.  Keeping the head of your bed raised 6 to 9 inches (15 to 23 cm) by using a foam wedge or blocks under the legs of the bed. Lying flat may make symptoms worse.  Being physically active. Weight loss may be helpful in reducing reflux in overweight or obese adults.  Wear loose fitting clothing EXAMPLE MEAL PLAN This meal plan is approximately 2,000 calories based on https://www.bernard.org/ meal planning guidelines. Breakfast   cup cooked oatmeal.  1 cup strawberries.  1 cup low-fat milk.  1 oz almonds. Snack  1 cup cucumber slices.  6 oz yogurt (made from low-fat or fat-free milk). Lunch  2 slice whole-wheat bread.  2 oz sliced [place].  2 tsp mayonnaise.  1 cup blueberries.  1 cup snap peas. Snack  6 whole-wheat crackers.  1 oz string cheese. Dinner   cup brown rice.  1 cup mixed veggies.  1 tsp olive oil.  3 oz grilled fish. Document Released:  08/21/2005 Document Revised: 11/13/2011 Document Reviewed: 07/07/2011 [facility] Patient Information 2013 [place], [place]. Gastroesophageal Reflux Disease, Adult Gastroesophageal reflux disease (GERD) happens when acid from your stomach goes into your food pipe (esophagus). The acid can cause a burning feeling in your chest. Over time, the acid can make small holes (ulcers) in your food pipe.  HOME CARE  Ask your doctor for advice about:  Losing weight.  Quitting smoking.  Alcohol use.  Avoid foods and drinks that make your problems worse. You may want to avoid:  Caffeine and alcohol.  Chocolate.  Mints.  Garlic and onions.  Spicy foods.  Citrus fruits, such as oranges, lemons, or limes.  Foods that contain tomato, such as sauce, chili, salsa, and pizza.  Fried and fatty foods.  Avoid lying down for 3 hours before you go to bed or before you take a nap.  Eat small meals often, instead of large meals.  Wear loose-fitting clothing. Do not wear anything tight around your waist.  Raise (elevate) the head of your bed 6 to 8 inches with wood blocks. Using extra  pillows does not help.  Only take medicines as told by your doctor.  Do not take aspirin or ibuprofen. GET HELP RIGHT AWAY IF:   You have pain in your arms, neck, jaw, teeth, or back.  Your pain gets worse or changes.  You feel sick to your stomach (nauseous), throw up (vomit), or sweat (diaphoresis).  You feel short of breath, or you pass out (faint).  Your throw up is green, yellow, black, or looks like coffee grounds or blood.  Your poop (stool) is red, bloody, or black. MAKE SURE YOU:   Understand these instructions.  Will watch your condition.  Will get help right away if you are not doing well or get worse. Document Released: 02/07/2008 Document Revised: 11/13/2011 Document Reviewed: 03/10/2011 [facility] Patient Information 2013 [place], [place]. Heartburn During Pregnancy  Heartburn is a burning  sensation in the chest caused by stomach acid backing up into the esophagus. Heartburn (also known as "reflux") is common in pregnancy because a certain hormone (progesterone) changes. The progesterone hormone may relax the valve that separates the esophagus from the stomach. This allows acid to go up into the esophagus, causing heartburn. Heartburn may also happen in pregnancy because the enlarging uterus pushes up on the stomach, which pushes more acid into the esophagus. This is especially true in the later stages of pregnancy. Heartburn problems usually go away after giving birth. CAUSES   The progesterone hormone.  Changing hormone levels.  The growing uterus that pushes stomach acid upward.  Large meals.  Certain foods and drinks.  Exercise.  Increased acid production. SYMPTOMS   Burning pain in the chest or lower throat.  Bitter taste in the mouth.  Coughing. DIAGNOSIS  Heartburn is typically diagnosed by your caregiver when taking a careful history of your concern. Your caregiver may order a blood test to check for a certain type of bacteria that is associated with heartburn. Sometimes, heartburn is diagnosed by prescribing a heartburn medicine to see if the symptoms improve. It is rare in pregnancy to have a procedure called an endoscopy. This is when a tube with a light and a camera on the end is used to examine the esophagus and the stomach. TREATMENT   Your caregiver may tell you to use certain over-the-counter medicines (antacids, acid reducers) for mild heartburn.  Your caregiver may prescribe medicines to decrease stomach acid or to protect your stomach lining.  Your caregiver may recommend certain diet changes.  For severe cases, your caregiver may recommend that the head of the bed be elevated on blocks. (Sleeping with more pillows is not an effective treatment as it only changes the position of your head and does not improve the main problem of stomach acid  refluxing into the esophagus.) HOME CARE INSTRUCTIONS   Take all medicines as directed by your caregiver.  Raise the head of your bed by putting blocks under the legs if instructed to by your caregiver.  Do not exercise right after eating.  Avoid eating 2 or 3 hours before bed. Do not lie down right after eating.  Eat small meals throughout the day instead of 3 large meals.  Identify foods and beverages that make your symptoms worse and avoid them. Foods you may want to avoid include:  Peppers.  Chocolate.  High-fat foods, including fried foods.  Spicy foods.  Garlic and onions.  Citrus fruits, including oranges, grapefruit, lemons, and limes.  Food containing tomatoes or tomato products.  Mint.  Carbonated and caffeinated drinks.  Vinegar. SEEK IMMEDIATE MEDICAL CARE IF:   You have severe chest pain that goes down your arm or into your jaw or neck.  You feel sweaty, dizzy, or lightheaded.  You become short of breath.  You vomit blood.  You have difficulty or pain with swallowing.  You have bloody or black, tarry stools.  You have episodes of heartburn more than 3 times a week, for more than 2 weeks. MAKE SURE YOU:  Understand these instructions.  Will watch your condition.  Will get help right away if you are not doing well or get worse. Document Released: 08/18/2000 Document Revised: 11/13/2011 Document Reviewed: 02/09/2011 [facility] Patient Information 2013 [place], [place]. How to Take Your Blood Pressure  These instructions are only for electronic home blood pressure machines. You will need:   An automatic or semi-automatic blood pressure machine.  Fresh batteries for the blood pressure machine. HOW DO I USE THESE TOOLS TO CHECK MY BLOOD PRESSURE?   There are 2 numbers that make up your blood pressure. For example: 120/80.  The first number (120 in our example) is called the "systolic pressure." It is a measure of the pressure in your blood  vessels when your heart is pumping blood.  The second number (80 in our example) is called the "diastolic pressure." It is a measure of the pressure in your blood vessels when your heart is resting between beats.  Before you buy a home blood pressure machine, check the size of your arm so you can buy the right size cuff. Here is how to check the size of your arm:  Use a tape measure that shows both inches and centimeters.  Wrap the tape measure around the middle upper part of your arm. You may need someone to help you measure right.  Write down your arm measurement in both inches and centimeters.  To measure your blood pressure right, it is important to have the right size cuff.  If your arm is up to 13 inches (37 to 34 centimeters), get an adult cuff size.  If your arm is 13 to 17 inches (35 to 44 centimeters), get a large adult cuff size.  If your arm is 17 to 20 inches (45 to 52 centimeters), get an adult thigh cuff.  Try to rest or relax for at least 30 minutes before you check your blood pressure.  Do not smoke.  Do not have any drinks with caffeine, such as:  Pop.  Coffee.  Tea.  Check your blood pressure in a quiet room.  Sit down and stretch out your arm on a table. Keep your arm at about the level of your heart. Let your arm relax. GETTING BLOOD PRESSURE READINGS  Make sure you remove any tight-fighting clothing from your arm. Wrap the cuff around your upper arm. Wrap it just above the bend, and above where you felt the pulse. You should be able to slip a finger between the cuff and your arm. If you cannot slip a finger in the cuff, it is too tight and should be removed and rewrapped.  Some units requires you to manually pump up the arm cuff.  Automatic units inflate the cuff when you press a button.  Cuff deflation is automatic in both models.  After the cuff is inflated, the unit measures your blood pressure and pulse. The readings are displayed on a monitor.  Hold still and breathe normally while the cuff is inflated.  Getting a reading takes less than a minute.  Some models store readings in a memory. Some provide a printout of readings.  Get readings at different times of the day. You should wait at least 5 minutes between readings. Take readings with you to your next doctor's visit. Document Released: 08/03/2008 Document Revised: 11/13/2011 Document Reviewed: 08/03/2008 [facility] Patient Information 2013 [place], [place]. Stroke Prevention Some medical conditions and behaviors are associated with an increased chance of having a stroke. You may prevent a stroke by making healthy choices and managing medical conditions. Reduce your risk of having a stroke by:  Staying physically active. Get at least 30 minutes of activity on most or all days.  Not smoking. It may also be helpful to avoid exposure to secondhand smoke.  Limiting alcohol use. Moderate alcohol use is considered to be:  No more than 2 drinks per day for men.  No more than 1 drink per day for nonpregnant women.  Eating healthy foods.  Include 5 or more servings of fruits and vegetables a day.  Certain diets may be prescribed to address high blood pressure, high cholesterol, diabetes, or obesity.  Managing your cholesterol levels.  A low-saturated fat, low-trans fat, low-cholesterol, and high-fiber diet may control cholesterol levels.  Take any prescribed medicines to control cholesterol as directed by your caregiver.  Managing your diabetes.  A controlled-carbohydrate, controlled-sugar diet is recommended to manage diabetes.  Take any prescribed medicines to control diabetes as directed by your caregiver.  Controlling your high blood pressure (hypertension).  A low-salt (sodium), low-saturated fat, low-trans fat, and low-cholesterol diet is recommended to manage high blood pressure.  Take any prescribed medicines to control hypertension as directed by your  caregiver.  Maintaining a healthy weight.  A reduced-calorie, low-sodium, low-saturated fat, low-trans fat, low-cholesterol diet is recommended to manage weight.  Stopping drug abuse.  Avoiding birth control pills.  Talk to your caregiver about the risks of taking birth control pills if you are over 67 years old, smoke, get migraines, or have ever had a blood clot.  Getting evaluated for sleep disorders (sleep apnea).  Talk to your caregiver about getting a sleep evaluation if you snore a lot or have excessive sleepiness.  Taking medicines as directed by your caregiver.  For some people, aspirin or blood thinners (anticoagulants) are helpful in reducing the risk of forming abnormal blood clots that can lead to stroke. If you have the irregular heart rhythm of atrial fibrillation, you should be on a blood thinner unless there is a good reason you cannot take them.  Understand all your medicine instructions. SEEK IMMEDIATE MEDICAL CARE IF:   You have sudden weakness or numbness of the face, arm, or leg, especially on one side of the body.  You have sudden confusion.  You have trouble speaking (aphasia) or understanding.  You have sudden trouble seeing in one or both eyes.  You have sudden trouble walking.  You have dizziness.  You have a loss of balance or coordination.  You have a sudden, severe headache with no known cause.  You have new chest pain or an irregular heartbeat. Any of these symptoms may represent a serious problem that is an emergency. Do not wait to see if the symptoms will go away. Get medical help right away. Call your local emergency services (911 in [place] Do not drive yourself to the hospital. Document Released: 09/28/2004 Document Revised: 11/13/2011 Document Reviewed: 04/10/2011 [facility] Patient Information 2013 [place], [place].

## 2012-10-03 NOTE — ED Provider Notes (Signed)
History    CSN: [id number]  Arrival date & time 10/03/12  1015   First MD Initiated Contact with Patient 10/03/12 1019     Chief Complaint  Patient presents with  . Diabetes  . Hypertension   HPI Pt has had a difficult time in the past several months since the [facility] close.  She's been having to go to different places for her medication refills.  She reports that she's here to have her medications refilled.  She is looking forward to getting Medicare benefits in the next several months.  She has cerebrovascular disease and had a stroke recently.  She has uncontrolled diabetes mellitus and hypertension.  She reports that she wants to work hard to try and prevent having another stroke.  She reports that she has been checking her blood sugars and her morning blood sugar is between 190-200.  The patient reports no hypoglycemia.  She is taking Lantus 50 units daily.  She's taking glimepiride.  She reports that she had been on metformin in the past but it was discontinued not because of intolerance or because of problems with her kidneys.    Past Medical History  Diagnosis Date  . Diabetes mellitus   . Sickle cell anemia   . Hyperlipidemia   . Thyroid disease   . Hypertension   . Ear infection   . Sinus problem     Past Surgical History  Procedure Date  . Lipoma resection July 2012    8cm, 4cm Left neck, right upper back  . Abdominal hysterectomy     Family History  Problem Relation Age of Onset  . Heart disease Mother   . Hypertension Mother   . Diabetes Mother   . Heart disease Father   . Hypertension Father   . Diabetes Father   . Heart disease Sister   . Hypertension Sister   . Diabetes Sister   . Hypertension Brother   . Cancer Brother   . Diabetes Sister   . Hypertension Sister   . Diabetes Sister   . Hypertension Sister     History  Substance Use Topics  . Smoking status: Former Smoker    Quit date: 07/11/2005  . Smokeless tobacco: Never Used  . Alcohol  Use: Yes     Comment: occasional    OB History    Grav Para Term Preterm Abortions TAB SAB Ect Mult Living                 Review of Systems  Genitourinary: Positive for urgency and frequency.  Musculoskeletal: Positive for arthralgias.  Neurological: Positive for headaches. Negative for dizziness, syncope, facial asymmetry, speech difficulty, weakness, light-headedness and numbness.  All other systems reviewed and are negative.   Allergies  Sulfonamide derivatives and Codeine  Home Medications   Current Outpatient Rx  Name  Route  Sig  Dispense  Refill  . AMLODIPINE BESYLATE 5 MG PO TABS   Oral   Take 1 tablet (5 mg total) by mouth daily.   30 tablet   1   . CLOPIDOGREL BISULFATE 75 MG PO TABS   Oral   Take 1 tablet (75 mg total) by mouth daily.   30 tablet   1   . GLIMEPIRIDE 4 MG PO TABS   Oral   Take 1 tablet (4 mg total) by mouth 2 (two) times daily.   30 tablet   1   . HYDROCHLOROTHIAZIDE 25 MG PO TABS   Oral   Take  1 tablet (25 mg total) by mouth daily.   30 tablet   1   . INSULIN GLARGINE 100 UNIT/ML [place] SOLN   Subcutaneous   Inject 50 Units into the skin daily.   10 mL   1   . IRBESARTAN 150 MG PO TABS   Oral   Take 1 tablet (150 mg total) by mouth daily.   30 tablet   1   . LEVOTHYROXINE SODIUM 112 MCG PO TABS   Oral   Take 1 tablet (112 mcg total) by mouth daily.   30 tablet   1   . OMEGA-3 FATTY ACIDS 1000 MG PO CAPS   Oral   Take 2 g by mouth daily.         Marland Kitchen LORATADINE 10 MG PO CAPS   Oral   Take 10 mg by mouth daily as needed. For allergies         . OMEPRAZOLE 20 MG PO CPDR   Oral   Take 1 capsule (20 mg total) by mouth daily.   30 capsule   0   . PRAVASTATIN SODIUM 80 MG PO TABS   Oral   Take 1 tablet (80 mg total) by mouth daily.   30 tablet   1     BP 156/89  Pulse 79  Temp 97.8 F (36.6 C)  Resp 18  SpO2 99%  Physical Exam  Nursing note and vitals reviewed. Constitutional: She is oriented to person,  place, and time. She appears well-developed and well-nourished. No distress.  HENT:  Head: Normocephalic and atraumatic.  Nose: Nose normal.  Mouth/Throat: Oropharynx is clear and moist.  Eyes: Conjunctivae normal and EOM are normal. Pupils are equal, round, and reactive to light.  Neck: Normal range of motion. Neck supple. No JVD present. No thyromegaly present.  Cardiovascular: Normal rate, regular rhythm and normal heart sounds.   No murmur heard. Abdominal: Soft. Bowel sounds are normal. She exhibits no distension. There is no tenderness.  Musculoskeletal: Normal range of motion. She exhibits no edema and no tenderness.  Lymphadenopathy:    She has no cervical adenopathy.  Neurological: She is alert and oriented to person, place, and time. No cranial nerve deficit.  Skin: Skin is warm and dry. No rash noted. No erythema. No pallor.  Psychiatric: She has a normal mood and affect. Her behavior is normal. Judgment and thought content normal.   ED Course  Procedures (including critical care time)  Labs Reviewed - No data to display No results found.  No diagnosis found.  MDM  IMPRESSION  Hypertension, suboptimally controlled   Cardiovascular disease  Cerebrovascular disease  S/p CVA  Hyperlipidemia  Uncontrolled diabetes mellitus  Hypothyroidism  RECOMMENDATIONS / PLAN Check cbg in office Check labs today :  CMP, TSH, A1c, Lipid Panel, CBC Increased amlodipine to 10 mg po daily Continue lantus 50 units daily Decreased glimepiride to 4 mg po daily  (once per day instead of twice per day) Begin Metformin ER 500mg  with slow titration : 1 po daily for 1 week, then 2 po BIDAC for 2 weeks, then 2po BIDAC Continue to monitor BS closely,  Hypoglycemia precautions discussed with patient followup in 2 weeks with nurse for BP check Encouraged walking program 5x per week at 30 mins  FOLLOW UP 2 weeks for BP check nurse visit 3 months for regular medical follow up  visit  The patient was given clear instructions to go to ER or return to medical center if symptoms  don't improve, worsen or new problems develop.  The patient verbalized understanding.  The patient was told to call to get lab results if they haven't heard anything in the next week.            Cleora Fleet, MD 10/03/12 [phone]

## 2012-10-04 LAB — HEMOGLOBIN A1C
Hgb A1c MFr Bld: 10.7 % — ABNORMAL HIGH (ref <5.7)
Mean Plasma Glucose: 260 mg/dL — ABNORMAL HIGH (ref <117)

## 2012-10-04 NOTE — Progress Notes (Signed)
Quick Note:  Please notify patient that her diabetes is out of control as evidenced by an A1c of >10%. The total cholesterol is elevated. The thyroid test came back OK. Please get back on your medications and we should recheck your labs in 3 months.    Rodney Langton, MD, CDE, FAAFP [facility] [place], [place]   ______

## 2012-10-08 ENCOUNTER — Telehealth (HOSPITAL_COMMUNITY): Payer: Self-pay

## 2012-10-08 NOTE — Telephone Encounter (Signed)
Message copied by Lestine Mount on Tue Oct 08, 2012  7:12 PM ------      Message from: Cleora Fleet      Created: Fri Oct 04, 2012 10:23 AM       Please notify patient that her diabetes is out of control as evidenced by an A1c of >10%.  The total cholesterol is elevated.  The thyroid test came back OK.   Please get back on your medications and we should recheck your labs in 3 months.                   Rodney Langton, MD, CDE, FAAFP      [facility]      [facility]      [place], [place]

## 2012-10-09 ENCOUNTER — Telehealth (HOSPITAL_COMMUNITY): Payer: Self-pay

## 2012-10-09 NOTE — Telephone Encounter (Signed)
Message copied by Lestine Mount on Wed Oct 09, 2012 10:43 AM ------      Message from: Cleora Fleet      Created: Fri Oct 04, 2012 10:23 AM       Please notify patient that her diabetes is out of control as evidenced by an A1c of >10%.  The total cholesterol is elevated.  The thyroid test came back OK.   Please get back on your medications and we should recheck your labs in 3 months.                   Rodney Langton, MD, CDE, FAAFP      [facility]      [facility]      [place], [place]

## 2012-10-21 ENCOUNTER — Other Ambulatory Visit: Payer: Self-pay | Admitting: Internal Medicine

## 2012-10-21 DIAGNOSIS — Z1231 Encounter for screening mammogram for malignant neoplasm of breast: Secondary | ICD-10-CM

## 2012-10-21 MED ORDER — SYNTHROID 112 MCG PO TABS: 112.0000 ug | ORAL_TABLET | Freq: Every day | ORAL | Status: AC

## 2012-10-21 MED ORDER — VALSARTAN 80 MG PO TABS: 80.0000 mg | ORAL_TABLET | Freq: Every day | ORAL | Status: DC

## 2012-10-21 MED ORDER — ROSUVASTATIN CALCIUM 20 MG PO TABS: 20.0000 mg | ORAL_TABLET | Freq: Every day | ORAL | Status: DC

## 2012-10-22 ENCOUNTER — Encounter (HOSPITAL_COMMUNITY): Payer: Self-pay

## 2012-10-22 ENCOUNTER — Emergency Department (HOSPITAL_COMMUNITY)
Admission: EM | Admit: 2012-10-22 | Discharge: 2012-10-22 | Disposition: A | Discharge status: Home / Self Care | Payer: No Typology Code available for payment source | Source: Home / Self Care

## 2012-10-22 NOTE — ED Notes (Signed)
Patient here for blood pressure check

## 2012-10-24 ENCOUNTER — Ambulatory Visit (HOSPITAL_COMMUNITY)
Admission: RE | Admit: 2012-10-24 | Discharge: 2012-10-24 | Disposition: A | Discharge status: Home / Self Care | Payer: No Typology Code available for payment source | Source: Ambulatory Visit | Attending: Internal Medicine | Admitting: Internal Medicine

## 2012-10-24 DIAGNOSIS — Z1231 Encounter for screening mammogram for malignant neoplasm of breast: Secondary | ICD-10-CM

## 2012-10-28 ENCOUNTER — Other Ambulatory Visit: Payer: Self-pay | Admitting: Internal Medicine

## 2012-10-28 ENCOUNTER — Encounter (HOSPITAL_COMMUNITY): Payer: Self-pay | Admitting: *Deleted

## 2012-10-28 DIAGNOSIS — R928 Other abnormal and inconclusive findings on diagnostic imaging of breast: Secondary | ICD-10-CM

## 2012-11-05 ENCOUNTER — Encounter (HOSPITAL_COMMUNITY): Payer: Self-pay

## 2012-11-05 ENCOUNTER — Other Ambulatory Visit: Payer: Self-pay | Admitting: Internal Medicine

## 2012-11-05 ENCOUNTER — Ambulatory Visit (HOSPITAL_COMMUNITY)
Admission: RE | Admit: 2012-11-05 | Discharge: 2012-11-05 | Disposition: A | Discharge status: Home / Self Care | Payer: No Typology Code available for payment source | Source: Ambulatory Visit | Attending: Obstetrics and Gynecology | Admitting: Obstetrics and Gynecology

## 2012-11-05 VITALS — BP 140/86 | Ht 65.0 in | Wt 218.0 lb

## 2012-11-05 DIAGNOSIS — Z1239 Encounter for other screening for malignant neoplasm of breast: Secondary | ICD-10-CM

## 2012-11-05 DIAGNOSIS — R928 Other abnormal and inconclusive findings on diagnostic imaging of breast: Secondary | ICD-10-CM

## 2012-11-05 NOTE — Progress Notes (Signed)
Patient referred to [facility] by the [facility] due to needing additional imaging of the left breast. Screening mammogram completed at [facility] 10/24/2012.  Pap Smear:    Pap smear not completed today. Per patient last Pap smear was in the 1980's and normal. Patient has a history of a hysterectomy for DUB in the 1980's. Per patient no history of abnormal Pap smears. No Pap smear results in EPIC.  Physical exam: Breasts Breasts symmetrical. No skin abnormalities bilateral breasts. No nipple retraction bilateral breasts. No nipple discharge bilateral breasts. No lymphadenopathy. No lumps palpated bilateral breasts. No complaints of pain or tenderness on exam. Referred patient to the [facility] of [facility] for left breast diagnostic mammogram and possible ultrasound per recommendation. Appointment scheduled for Wednesday, November 06, 2012 at 0740.   Pelvic/Bimanual No Pap smear completed today since patient has a history of a hysterectomy for benign reasons. Pap smear not indicated per BCCCP guidelines.

## 2012-11-05 NOTE — Patient Instructions (Addendum)
Taught patient how to perform BSE. Patient did not need a Pap smear today due to history of a hysterectomy for benign reasons. Let patient know that she would not need any further Pap smears due to her history of a hysterectomy for benign reasons. Referred patient to the [facility] of [facility] for left breast diagnostic mammogram and possible ultrasound per recommendation. Appointment scheduled for Wednesday, November 06, 2012 at 0740. Patient aware of appointment and will be there. Patient verbalized understanding.

## 2012-11-06 ENCOUNTER — Other Ambulatory Visit: Payer: Self-pay | Admitting: Internal Medicine

## 2012-11-06 ENCOUNTER — Ambulatory Visit
Admission: RE | Admit: 2012-11-06 | Discharge: 2012-11-06 | Disposition: A | Discharge status: Home / Self Care | Payer: No Typology Code available for payment source | Source: Ambulatory Visit | Attending: Internal Medicine | Admitting: Internal Medicine

## 2012-11-06 ENCOUNTER — Other Ambulatory Visit: Payer: Self-pay | Admitting: Obstetrics and Gynecology

## 2012-11-06 DIAGNOSIS — R921 Mammographic calcification found on diagnostic imaging of breast: Secondary | ICD-10-CM

## 2012-11-06 DIAGNOSIS — R928 Other abnormal and inconclusive findings on diagnostic imaging of breast: Secondary | ICD-10-CM

## 2012-11-07 ENCOUNTER — Telehealth: Payer: Self-pay | Admitting: Internal Medicine

## 2012-11-07 NOTE — Telephone Encounter (Signed)
This patient is not followed in the [facility], and I was incorrectly entered as the authorizing physician for patient's screening and diagnostic mammograms by radiology staff.  I previously called and discussed this with Jacqlyn Larsen in radiology, who referred patient to the Breast and Cervical Cancer Control Program (B[facility]CP) Clinic at [facility], where patient was seen on 11/05/2012.  I then received orders to sign for a breast biopsy, but since patient is not followed in our clinic and has never been seen in our clinic, I again spoke with Jacqlyn Larsen yesterday who reassigned those orders to Dr. Catalina Antigua at the [facility].  She also indicated any further needed management would be directed by Dr. Jolayne Panther and the [facility] clinic.  Our clinic office manager had also scheduled an appointment in our clinic for 11/08/2012 to allow patient to establish primary care in our clinic if desired, but the patient subsequently canceled that appointment.  Patient returned call today and explained that she already had an appointment scheduled to establish care with a primary care physician at another practice on May 1, and therefore she did not wish to establish care in our clinic.  She also understands that she will need to followup at the [facility] clinic regarding her abnormal mammogram findings and subsequent workup and treatment.

## 2012-11-08 ENCOUNTER — Ambulatory Visit: Payer: No Typology Code available for payment source | Admitting: Internal Medicine

## 2012-11-11 ENCOUNTER — Encounter: Payer: Self-pay | Admitting: Family Medicine

## 2012-11-11 ENCOUNTER — Ambulatory Visit
Admission: RE | Admit: 2012-11-11 | Discharge: 2012-11-11 | Disposition: A | Discharge status: Home / Self Care | Payer: No Typology Code available for payment source | Source: Ambulatory Visit | Attending: Internal Medicine | Admitting: Internal Medicine

## 2012-11-11 DIAGNOSIS — R921 Mammographic calcification found on diagnostic imaging of breast: Secondary | ICD-10-CM

## 2012-11-15 MED ORDER — INSULIN GLARGINE 100 UNIT/ML ~~LOC~~ SOLN: 50.0000 [IU] | Freq: Every day | SUBCUTANEOUS | Status: DC

## 2012-11-15 MED ORDER — INSULIN GLARGINE 100 UNIT/ML ~~LOC~~ SOLN: 50.0000 [IU] | Freq: Every day | SUBCUTANEOUS | Status: AC

## 2012-11-19 ENCOUNTER — Ambulatory Visit (INDEPENDENT_AMBULATORY_CARE_PROVIDER_SITE_OTHER): Payer: PRIVATE HEALTH INSURANCE | Admitting: Surgery

## 2012-11-19 ENCOUNTER — Encounter (INDEPENDENT_AMBULATORY_CARE_PROVIDER_SITE_OTHER): Payer: Self-pay | Admitting: Surgery

## 2012-11-19 VITALS — BP 152/86 | HR 88 | Temp 97.4°F | Resp 24 | Ht 65.0 in | Wt 216.0 lb

## 2012-11-19 DIAGNOSIS — N6099 Unspecified benign mammary dysplasia of unspecified breast: Secondary | ICD-10-CM | POA: Insufficient documentation

## 2012-11-19 DIAGNOSIS — N6089 Other benign mammary dysplasias of unspecified breast: Secondary | ICD-10-CM

## 2012-11-19 NOTE — Progress Notes (Signed)
Patient ID: [name], female   DOB: [birth year], 67 y.o.   MRN: [id number]  Chief Complaint  Patient presents with  . 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 female.   HPI She is referred by Dr. Georganna Skeans for evaluation of an abnormality in the left breast. This was found on recent screening mammography. She has had no nipple discharge. She has had no previous history of problems with her breast. She is otherwise without complaints. Past Medical History  Diagnosis Date  . Diabetes mellitus   . Sickle cell anemia   . Hyperlipidemia   . Thyroid disease   . Hypertension   . Ear infection   . Sinus problem   . Stroke   . CHF (congestive heart failure)   . Diverticula, intestine     Past Surgical History  Procedure Laterality Date  . Lipoma resection  July 2012    8cm, 4cm Left neck, right upper back  . Abdominal hysterectomy    . Bladder suspension    . Bilateral oophorectomy      Family History  Problem Relation Age of Onset  . Heart disease Mother   . Hypertension Mother   . Diabetes Mother   . Heart disease Father   . Hypertension Father   . Diabetes Father   . Heart disease Sister   . Hypertension Sister   . Diabetes Sister   . Hypertension Brother   . Cancer Brother   . Diabetes Sister   . Hypertension Sister   . Diabetes Sister   . Hypertension Sister     Social History History  Substance Use Topics  . Smoking status: Former Smoker    Quit date: 07/11/2005  . Smokeless tobacco: Never Used  . Alcohol Use: Yes     Comment: occasional    Allergies  Allergen Reactions  . Sulfonamide Derivatives Shortness Of Breath  . Codeine Itching    Current Outpatient Prescriptions  Medication Sig Dispense Refill  . amLODipine (NORVASC) 10 MG tablet Take 1 tablet (10 mg total) by mouth daily.  30 tablet  3  . clopidogrel (PLAVIX) 75 MG tablet Take 1 tablet (75 mg total) by mouth daily.  30 tablet  3  .  glimepiride (AMARYL) 4 MG tablet Take 1 tablet (4 mg total) by mouth daily before breakfast.  30 tablet  3  . hydrochlorothiazide (HYDRODIURIL) 25 MG tablet Take 1 tablet (25 mg total) by mouth daily.  30 tablet  3  . insulin glargine (LANTUS) 100 UNIT/ML injection Inject 50 Units into the skin daily.  10 mL  3  . levothyroxine (SYNTHROID, LEVOTHROID) 100 MCG tablet Take 100 mcg by mouth daily.      . pravastatin (PRAVACHOL) 10 MG tablet Take 10 mg by mouth daily.      Marland Kitchen SYNTHROID 112 MCG tablet Take 1 tablet (112 mcg total) by mouth daily.  30 tablet  3  . [DISCONTINUED] irbesartan (AVAPRO) 150 MG tablet Take 1 tablet (150 mg total) by mouth daily.  30 tablet  3   No current facility-administered medications for this visit.    Review of Systems Review of Systems  Constitutional: Negative for fever, chills and unexpected weight change.  HENT: Negative for hearing loss, congestion, sore throat, trouble swallowing and voice change.   Eyes: Negative for visual disturbance.  Respiratory: Negative for cough and wheezing.   Cardiovascular: Negative for chest pain, palpitations and  leg swelling.  Gastrointestinal: Negative for nausea, vomiting, abdominal pain, diarrhea, constipation, blood in stool, abdominal distention and anal bleeding.  Genitourinary: Negative for hematuria, vaginal bleeding and difficulty urinating.  Musculoskeletal: Negative for arthralgias.  Skin: Negative for rash and wound.  Neurological: Negative for seizures, syncope and headaches.  Hematological: Negative for adenopathy. Does not bruise/bleed easily.  Psychiatric/Behavioral: Negative for confusion.    Blood pressure 152/86, pulse 88, temperature 97.4 F (36.3 C), temperature source Temporal, resp. rate 24, height 5\' 5"  (1.651 m), weight 216 lb (97.977 kg).  Physical Exam Physical Exam  Constitutional: She is oriented to person, place, and time. She appears well-developed and well-nourished. No distress.  HENT:   Head: Normocephalic and atraumatic.  Right Ear: External ear normal.  Left Ear: External ear normal.  Nose: Nose normal.  Mouth/Throat: Oropharynx is clear and moist. No oropharyngeal exudate.  Eyes: Conjunctivae are normal. Pupils are equal, round, and reactive to light. Right eye exhibits no discharge. Left eye exhibits no discharge. No scleral icterus.  Neck: Normal range of motion. Neck supple. No tracheal deviation present. No thyromegaly present.  Cardiovascular: Normal rate, regular rhythm, normal heart sounds and intact distal pulses.   No murmur heard. Pulmonary/Chest: Effort normal and breath sounds normal. No respiratory distress. She has no wheezes.  Lymphadenopathy:    She has no cervical adenopathy.    She has no axillary adenopathy.  Neurological: She is alert and oriented to person, place, and time.  Skin: Skin is warm and dry. No rash noted. She is not diaphoretic.  Psychiatric: Her behavior is normal.  Breasts: There is a small scar from the previous biopsy of left breast. I can palpate a small hematoma beneath this. There is no ecchymosis. There are no masses otherwise in either breast. Areola and nipples are normal  Data Reviewed I have reviewed her mammograms showing an abnormality in the left breast. The pathology report shows atypical ductal hyperplasia  Assessment    Atypical ductal hyperplasia left breast     Plan    Left breast needle localized lumpectomy was recommended. I discussed this with her in detail. I discussed the risks of surgery which includes but is not limited to bleeding, infection, need for further surgery, et Karie Soda. She understands and wishes to proceed. Likelihood of success is good        Parsa Rickett A 11/19/2012, 11:15 AM

## 2012-11-26 ENCOUNTER — Encounter: Payer: Self-pay | Admitting: Family Medicine

## 2012-11-26 ENCOUNTER — Encounter (HOSPITAL_BASED_OUTPATIENT_CLINIC_OR_DEPARTMENT_OTHER)
Admission: RE | Admit: 2012-11-26 | Discharge: 2012-11-26 | Disposition: A | Discharge status: Home / Self Care | Payer: No Typology Code available for payment source | Source: Ambulatory Visit | Attending: Surgery | Admitting: Surgery

## 2012-11-26 ENCOUNTER — Encounter (HOSPITAL_BASED_OUTPATIENT_CLINIC_OR_DEPARTMENT_OTHER): Payer: Self-pay | Admitting: *Deleted

## 2012-11-26 DIAGNOSIS — Z0189 Encounter for other specified special examinations: Secondary | ICD-10-CM | POA: Insufficient documentation

## 2012-11-26 LAB — BASIC METABOLIC PANEL
Calcium: 9.6 mg/dL (ref 8.4–10.5)
GFR calc Af Amer: >90 mL/min (ref >90)
GFR calc non Af Amer: >90 mL/min (ref >90)
Sodium: 136 mEq/L (ref 135–145)

## 2012-11-26 NOTE — Progress Notes (Signed)
Reviewed case with dr Dena Billet

## 2012-11-26 NOTE — Progress Notes (Signed)
11/26/12 1210  OBSTRUCTIVE SLEEP APNEA  Have you ever been diagnosed with sleep apnea through a sleep study? No  Do you snore loudly (loud enough to be heard through closed doors)?  1  Do you often feel tired, fatigued, or sleepy during the daytime? 0  Has anyone observed you stop breathing during your sleep? 0  Do you have, or are you being treated for high blood pressure? 1  BMI more than 35 kg/m2? 1  Age over 65 years old? 1  Gender: 0  Obstructive Sleep Apnea Score 4  Score 4 or greater  Results sent to PCP

## 2012-11-26 NOTE — Progress Notes (Signed)
Pt had a mild tia-cva 7/13 resolved quickly-put on plavix-off for surgery-no cardiac-[facility]-now goes to urgent care- bmet done=pt looks good for age and health issues-diabetes not well controlled No resp problems Her sister in hospital-she walked over here with no sob or problems .

## 2012-11-26 NOTE — H&P (Signed)
Patient ID: [name], female DOB: 1946-04-07, 67 y.o. MRN: [id number]  Chief Complaint   Patient presents with   .  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 female.  HPI  She is referred by Dr. Georganna Skeans for evaluation of an abnormality in the left breast. This was found on recent screening mammography. She has had no nipple discharge. She has had no previous history of problems with her breast. She is otherwise without complaints.  Past Medical History   Diagnosis  Date   .  Diabetes mellitus    .  Sickle cell anemia    .  Hyperlipidemia    .  Thyroid disease    .  Hypertension    .  Ear infection    .  Sinus problem    .  Stroke    .  CHF (congestive heart failure)    .  Diverticula, intestine     Past Surgical History   Procedure  Laterality  Date   .  Lipoma resection   July 2012     8cm, 4cm Left neck, right upper back   .  Abdominal hysterectomy     .  Bladder suspension     .  Bilateral oophorectomy      Family History   Problem  Relation  Age of Onset   .  Heart disease  Mother    .  Hypertension  Mother    .  Diabetes  Mother    .  Heart disease  Father    .  Hypertension  Father    .  Diabetes  Father    .  Heart disease  Sister    .  Hypertension  Sister    .  Diabetes  Sister    .  Hypertension  Brother    .  Cancer  Brother    .  Diabetes  Sister    .  Hypertension  Sister    .  Diabetes  Sister    .  Hypertension  Sister    Social History  History   Substance Use Topics   .  Smoking status:  Former Smoker     Quit date:  07/11/2005   .  Smokeless tobacco:  Never Used   .  Alcohol Use:  Yes      Comment: occasional    Allergies   Allergen  Reactions   .  Sulfonamide Derivatives  Shortness Of Breath   .  Codeine  Itching    Current Outpatient Prescriptions   Medication  Sig  Dispense  Refill   .  amLODipine (NORVASC) 10 MG tablet  Take 1 tablet (10 mg total) by mouth daily.  30 tablet  3    .  clopidogrel (PLAVIX) 75 MG tablet  Take 1 tablet (75 mg total) by mouth daily.  30 tablet  3   .  glimepiride (AMARYL) 4 MG tablet  Take 1 tablet (4 mg total) by mouth daily before breakfast.  30 tablet  3   .  hydrochlorothiazide (HYDRODIURIL) 25 MG tablet  Take 1 tablet (25 mg total) by mouth daily.  30 tablet  3   .  insulin glargine (LANTUS) 100 UNIT/ML injection  Inject 50 Units into the skin daily.  10 mL  3   .  levothyroxine (SYNTHROID, LEVOTHROID) 100 MCG tablet  Take 100 mcg by mouth daily.     Marland Kitchen  pravastatin (PRAVACHOL) 10 MG tablet  Take 10 mg by mouth daily.     Marland Kitchen  SYNTHROID 112 MCG tablet  Take 1 tablet (112 mcg total) by mouth daily.  30 tablet  3   .  [DISCONTINUED] irbesartan (AVAPRO) 150 MG tablet  Take 1 tablet (150 mg total) by mouth daily.  30 tablet  3    No current facility-administered medications for this visit.   Review of Systems  Review of Systems  Constitutional: Negative for fever, chills and unexpected weight change.  HENT: Negative for hearing loss, congestion, sore throat, trouble swallowing and voice change.  Eyes: Negative for visual disturbance.  Respiratory: Negative for cough and wheezing.  Cardiovascular: Negative for chest pain, palpitations and leg swelling.  Gastrointestinal: Negative for nausea, vomiting, abdominal pain, diarrhea, constipation, blood in stool, abdominal distention and anal bleeding.  Genitourinary: Negative for hematuria, vaginal bleeding and difficulty urinating.  Musculoskeletal: Negative for arthralgias.  Skin: Negative for rash and wound.  Neurological: Negative for seizures, syncope and headaches.  Hematological: Negative for adenopathy. Does not bruise/bleed easily.  Psychiatric/Behavioral: Negative for confusion.  Blood pressure 152/86, pulse 88, temperature 97.4 F (36.3 C), temperature source Temporal, resp. rate 24, height 5\' 5"  (1.651 m), weight 216 lb (97.977 kg).  Physical Exam  Physical Exam  Constitutional:  She is oriented to person, place, and time. She appears well-developed and well-nourished. No distress.  HENT:  Head: Normocephalic and atraumatic.  Right Ear: External ear normal.  Left Ear: External ear normal.  Nose: Nose normal.  Mouth/Throat: Oropharynx is clear and moist. No oropharyngeal exudate.  Eyes: Conjunctivae are normal. Pupils are equal, round, and reactive to light. Right eye exhibits no discharge. Left eye exhibits no discharge. No scleral icterus.  Neck: Normal range of motion. Neck supple. No tracheal deviation present. No thyromegaly present.  Cardiovascular: Normal rate, regular rhythm, normal heart sounds and intact distal pulses.  No murmur heard.  Pulmonary/Chest: Effort normal and breath sounds normal. No respiratory distress. She has no wheezes.  Lymphadenopathy:  She has no cervical adenopathy.  She has no axillary adenopathy.  Neurological: She is alert and oriented to person, place, and time.  Skin: Skin is warm and dry. No rash noted. She is not diaphoretic.  Psychiatric: Her behavior is normal.  Breasts: There is a small scar from the previous biopsy of left breast. I can palpate a small hematoma beneath this. There is no ecchymosis. There are no masses otherwise in either breast. Areola and nipples are normal  Data Reviewed  I have reviewed her mammograms showing an abnormality in the left breast. The pathology report shows atypical ductal hyperplasia  Assessment  Atypical ductal hyperplasia left breast  Plan  Left breast needle localized lumpectomy was recommended. I discussed this with her in detail. I discussed the risks of surgery which includes but is not limited to bleeding, infection, need for further surgery, et Karie Soda. She understands and wishes to proceed. Likelihood of success is good

## 2012-11-29 ENCOUNTER — Ambulatory Visit (HOSPITAL_BASED_OUTPATIENT_CLINIC_OR_DEPARTMENT_OTHER): Payer: No Typology Code available for payment source | Admitting: Certified Registered Nurse Anesthetist

## 2012-11-29 ENCOUNTER — Encounter (HOSPITAL_BASED_OUTPATIENT_CLINIC_OR_DEPARTMENT_OTHER): Payer: Self-pay | Admitting: Certified Registered Nurse Anesthetist

## 2012-11-29 ENCOUNTER — Ambulatory Visit
Admission: RE | Admit: 2012-11-29 | Discharge: 2012-11-29 | Disposition: A | Discharge status: Home / Self Care | Payer: No Typology Code available for payment source | Source: Ambulatory Visit | Attending: Surgery | Admitting: Surgery

## 2012-11-29 ENCOUNTER — Encounter (HOSPITAL_BASED_OUTPATIENT_CLINIC_OR_DEPARTMENT_OTHER)
Admission: RE | Disposition: A | Discharge status: Home / Self Care | Payer: Self-pay | Source: Ambulatory Visit | Attending: Surgery

## 2012-11-29 ENCOUNTER — Ambulatory Visit (HOSPITAL_BASED_OUTPATIENT_CLINIC_OR_DEPARTMENT_OTHER)
Admission: RE | Admit: 2012-11-29 | Discharge: 2012-11-29 | Disposition: A | Discharge status: Home / Self Care | Payer: No Typology Code available for payment source | Source: Ambulatory Visit | Attending: Surgery | Admitting: Surgery

## 2012-11-29 DIAGNOSIS — I10 Essential (primary) hypertension: Secondary | ICD-10-CM | POA: Insufficient documentation

## 2012-11-29 DIAGNOSIS — Z8673 Personal history of transient ischemic attack (TIA), and cerebral infarction without residual deficits: Secondary | ICD-10-CM | POA: Insufficient documentation

## 2012-11-29 DIAGNOSIS — N6089 Other benign mammary dysplasias of unspecified breast: Secondary | ICD-10-CM | POA: Insufficient documentation

## 2012-11-29 DIAGNOSIS — E119 Type 2 diabetes mellitus without complications: Secondary | ICD-10-CM | POA: Insufficient documentation

## 2012-11-29 DIAGNOSIS — N6099 Unspecified benign mammary dysplasia of unspecified breast: Secondary | ICD-10-CM

## 2012-11-29 DIAGNOSIS — E785 Hyperlipidemia, unspecified: Secondary | ICD-10-CM | POA: Insufficient documentation

## 2012-11-29 DIAGNOSIS — I509 Heart failure, unspecified: Secondary | ICD-10-CM | POA: Insufficient documentation

## 2012-11-29 DIAGNOSIS — E079 Disorder of thyroid, unspecified: Secondary | ICD-10-CM | POA: Insufficient documentation

## 2012-11-29 HISTORY — DX: Adverse effect of unspecified anesthetic, initial encounter: T41.45XA

## 2012-11-29 HISTORY — DX: Presence of dental prosthetic device (complete) (partial): Z97.2

## 2012-11-29 HISTORY — PX: BREAST LUMPECTOMY WITH NEEDLE LOCALIZATION: SHX5759

## 2012-11-29 HISTORY — DX: Presence of spectacles and contact lenses: Z97.3

## 2012-11-29 HISTORY — DX: Other complications of anesthesia, initial encounter: T88.59XA

## 2012-11-29 LAB — GLUCOSE, CAPILLARY: Glucose-Capillary: 146 mg/dL — ABNORMAL HIGH (ref 70–99)

## 2012-11-29 SURGERY — BREAST LUMPECTOMY WITH NEEDLE LOCALIZATION
Anesthesia: General | Site: Breast | Laterality: Left | Wound class: Clean

## 2012-11-29 MED ORDER — FENTANYL CITRATE 0.05 MG/ML IJ SOLN
INTRAMUSCULAR | Status: DC | PRN
  Administered 2012-11-29 (×2): 50 ug via INTRAVENOUS

## 2012-11-29 MED ORDER — HYDROCODONE-ACETAMINOPHEN 5-325 MG PO TABS: 1.0000 | ORAL_TABLET | ORAL | Status: DC | PRN

## 2012-11-29 MED ORDER — MIDAZOLAM HCL 2 MG/2ML IJ SOLN: 1.0000 mg | INTRAMUSCULAR | Status: DC | PRN

## 2012-11-29 MED ORDER — LACTATED RINGERS IV SOLN
INTRAVENOUS | Status: DC
  Administered 2012-11-29: 12:00:00 via INTRAVENOUS
  Administered 2012-11-29: 20 mL/h via INTRAVENOUS

## 2012-11-29 MED ORDER — HYDROMORPHONE HCL PF 1 MG/ML IJ SOLN
0.2500 mg | INTRAMUSCULAR | Status: DC | PRN
  Administered 2012-11-29 (×3): 0.5 mg via INTRAVENOUS

## 2012-11-29 MED ORDER — ONDANSETRON HCL 4 MG/2ML IJ SOLN
INTRAMUSCULAR | Status: DC | PRN
  Administered 2012-11-29: 4 mg via INTRAVENOUS

## 2012-11-29 MED ORDER — DEXTROSE 5 % IV SOLN
3.0000 g | INTRAVENOUS | Status: AC
  Administered 2012-11-29: 3 g via INTRAVENOUS

## 2012-11-29 MED ORDER — OXYCODONE HCL 5 MG PO TABS: 5.0000 mg | ORAL_TABLET | Freq: Once | ORAL | Status: DC | PRN

## 2012-11-29 MED ORDER — PROPOFOL 10 MG/ML IV BOLUS
INTRAVENOUS | Status: DC | PRN
  Administered 2012-11-29: 200 mg via INTRAVENOUS

## 2012-11-29 MED ORDER — MIDAZOLAM HCL 5 MG/5ML IJ SOLN
INTRAMUSCULAR | Status: DC | PRN
  Administered 2012-11-29: 2 mg via INTRAVENOUS

## 2012-11-29 MED ORDER — FENTANYL CITRATE 0.05 MG/ML IJ SOLN: 50.0000 ug | INTRAMUSCULAR | Status: DC | PRN

## 2012-11-29 MED ORDER — DEXAMETHASONE SODIUM PHOSPHATE 4 MG/ML IJ SOLN
INTRAMUSCULAR | Status: DC | PRN
  Administered 2012-11-29: 4 mg via INTRAVENOUS

## 2012-11-29 MED ORDER — BUPIVACAINE-EPINEPHRINE 0.5% -1:200000 IJ SOLN
INTRAMUSCULAR | Status: DC | PRN
  Administered 2012-11-29: 16.5 mL

## 2012-11-29 MED ORDER — ONDANSETRON HCL 4 MG/2ML IJ SOLN: 4.0000 mg | Freq: Once | INTRAMUSCULAR | Status: DC | PRN

## 2012-11-29 MED ORDER — OXYCODONE HCL 5 MG/5ML PO SOLN: 5.0000 mg | Freq: Once | ORAL | Status: DC | PRN

## 2012-11-29 MED ORDER — LIDOCAINE HCL (CARDIAC) 20 MG/ML IV SOLN
INTRAVENOUS | Status: DC | PRN
  Administered 2012-11-29: 60 mg via INTRAVENOUS

## 2012-11-29 SURGICAL SUPPLY — 47 items
APL SKNCLS STERI-STRIP NONHPOA (GAUZE/BANDAGES/DRESSINGS) ×1
BENZOIN TINCTURE PRP APPL 2/3 (GAUZE/BANDAGES/DRESSINGS) ×2 IMPLANT
BLADE HEX COATED 2.75 (ELECTRODE) ×2 IMPLANT
BLADE SURG 15 STRL LF DISP TIS (BLADE) ×1 IMPLANT
BLADE SURG 15 STRL SS (BLADE) ×2
CANISTER SUCTION 1200CC (MISCELLANEOUS) IMPLANT
CHLORAPREP W/TINT 26ML (MISCELLANEOUS) ×2 IMPLANT
CLIP TI WIDE RED SMALL 6 (CLIP) IMPLANT
CLOTH BEACON ORANGE TIMEOUT ST (SAFETY) ×2 IMPLANT
COVER MAYO STAND STRL (DRAPES) ×2 IMPLANT
COVER TABLE BACK 60X90 (DRAPES) ×2 IMPLANT
DECANTER SPIKE VIAL GLASS SM (MISCELLANEOUS) IMPLANT
DEVICE DUBIN W/COMP PLATE 8390 (MISCELLANEOUS) IMPLANT
DRAPE PED LAPAROTOMY (DRAPES) ×2 IMPLANT
DRAPE UTILITY XL STRL (DRAPES) ×2 IMPLANT
DRSG TEGADERM 4X4.75 (GAUZE/BANDAGES/DRESSINGS) ×2 IMPLANT
ELECT REM PT RETURN 9FT ADLT (ELECTROSURGICAL) ×2
ELECTRODE REM PT RTRN 9FT ADLT (ELECTROSURGICAL) ×1 IMPLANT
GAUZE SPONGE 4X4 12PLY STRL LF (GAUZE/BANDAGES/DRESSINGS) ×2 IMPLANT
GLOVE BIOGEL PI IND STRL 7.0 (GLOVE) IMPLANT
GLOVE BIOGEL PI IND STRL 8 (GLOVE) IMPLANT
GLOVE BIOGEL PI INDICATOR 7.0 (GLOVE) ×1
GLOVE BIOGEL PI INDICATOR 8 (GLOVE) ×1
GLOVE ECLIPSE 6.5 STRL STRAW (GLOVE) ×1 IMPLANT
GLOVE SS BIOGEL STRL SZ 8 (GLOVE) IMPLANT
GLOVE SUPERSENSE BIOGEL SZ 8 (GLOVE) ×1
GLOVE SURG SIGNA 7.5 PF LTX (GLOVE) ×2 IMPLANT
GOWN PREVENTION PLUS XLARGE (GOWN DISPOSABLE) ×3 IMPLANT
GOWN PREVENTION PLUS XXLARGE (GOWN DISPOSABLE) ×2 IMPLANT
KIT MARKER MARGIN INK (KITS) ×2 IMPLANT
NDL HYPO 25X1 1.5 SAFETY (NEEDLE) ×1 IMPLANT
NEEDLE HYPO 25X1 1.5 SAFETY (NEEDLE) ×2 IMPLANT
NS IRRIG 1000ML POUR BTL (IV SOLUTION) ×2 IMPLANT
PACK BASIN DAY SURGERY FS (CUSTOM PROCEDURE TRAY) ×2 IMPLANT
PENCIL BUTTON HOLSTER BLD 10FT (ELECTRODE) ×2 IMPLANT
SLEEVE SCD COMPRESS KNEE MED (MISCELLANEOUS) ×1 IMPLANT
SPONGE LAP 4X18 X RAY DECT (DISPOSABLE) ×2 IMPLANT
STRIP CLOSURE SKIN 1/2X4 (GAUZE/BANDAGES/DRESSINGS) ×2 IMPLANT
SUT MNCRL AB 4-0 PS2 18 (SUTURE) ×2 IMPLANT
SUT SILK 2 0 SH (SUTURE) ×2 IMPLANT
SUT VIC AB 3-0 SH 27 (SUTURE) ×2
SUT VIC AB 3-0 SH 27X BRD (SUTURE) ×1 IMPLANT
SYR CONTROL 10ML LL (SYRINGE) ×2 IMPLANT
TOWEL OR 17X24 6PK STRL BLUE (TOWEL DISPOSABLE) ×2 IMPLANT
TOWEL OR NON WOVEN STRL DISP B (DISPOSABLE) ×2 IMPLANT
TUBE CONNECTING 20X1/4 (TUBING) IMPLANT
YANKAUER SUCT BULB TIP NO VENT (SUCTIONS) IMPLANT

## 2012-11-29 NOTE — Interval H&P Note (Signed)
History and Physical Interval Note: no change in h and P  11/29/2012 12:00 PM  [name]  has presented today for surgery, with the diagnosis of atypical ductal hyperplasia left breast  The various methods of treatment have been discussed with the patient and family. After consideration of risks, benefits and other options for treatment, the patient has consented to  Procedure(s): BREAST LUMPECTOMY WITH NEEDLE LOCALIZATION (Left) as a surgical intervention .  The patient's history has been reviewed, patient examined, no change in status, stable for surgery.  I have reviewed the patient's chart and labs.  Questions were answered to the patient's satisfaction.     Geremiah Fussell A

## 2012-11-29 NOTE — Anesthesia Procedure Notes (Signed)
Procedure Name: LMA Insertion Date/Time: 11/29/2012 12:20 PM Performed by: Maryrose Colvin D Pre-anesthesia Checklist: Patient identified, Emergency Drugs available, Suction available and Patient being monitored Patient Re-evaluated:Patient Re-evaluated prior to inductionOxygen Delivery Method: Circle System Utilized Preoxygenation: Pre-oxygenation with 100% oxygen Intubation Type: IV induction Ventilation: Mask ventilation without difficulty LMA: LMA inserted LMA Size: 4.0 Number of attempts: 1 Airway Equipment and Method: bite block Placement Confirmation: positive ETCO2 Tube secured with: Tape Dental Injury: Teeth and Oropharynx as per pre-operative assessment

## 2012-11-29 NOTE — Transfer of Care (Signed)
Immediate Anesthesia Transfer of Care Note  Patient: [name]  Procedure(s) Performed: Procedure(s): BREAST LUMPECTOMY WITH NEEDLE LOCALIZATION (Left)  Patient Location: PACU  Anesthesia Type:General  Level of Consciousness: awake and patient cooperative  Airway & Oxygen Therapy: Patient Spontanous Breathing and Patient connected to face mask oxygen  Post-op Assessment: Report given to PACU RN and Post -op Vital signs reviewed and stable  Post vital signs: Reviewed and stable  Complications: No apparent anesthesia complications

## 2012-11-29 NOTE — Anesthesia Postprocedure Evaluation (Signed)
  Anesthesia Post-op Note  Patient: [name]  Procedure(s) Performed: Procedure(s): BREAST LUMPECTOMY WITH NEEDLE LOCALIZATION (Left)  Patient Location: PACU  Anesthesia Type:General  Level of Consciousness: awake, alert  and oriented  Airway and Oxygen Therapy: Patient Spontanous Breathing and Patient connected to face mask oxygen  Post-op Pain: moderate  Post-op Assessment: Post-op Vital signs reviewed  Post-op Vital Signs: Reviewed  Complications: No apparent anesthesia complications

## 2012-11-29 NOTE — Op Note (Signed)
BREAST LUMPECTOMY WITH NEEDLE LOCALIZATION  Procedure Note  [name] 11/29/2012   Pre-op Diagnosis: atypical ductal hyperplasia left breast     Post-op Diagnosis: same  Procedure(s): LEFT BREAST LUMPECTOMY WITH NEEDLE LOCALIZATION  Surgeon(s): Shelly Rubenstein, MD  Anesthesia: General  Staff:  Circulator: Chrystine Oiler, RN Relief Scrub: Joylene Grapes, RN Scrub Person: Raliegh Scarlet, RN  Estimated Blood Loss: Minimal               Specimens: sent to path          [facility]   Date: 11/29/2012  Time: 12:53 PM

## 2012-11-29 NOTE — Anesthesia Preprocedure Evaluation (Signed)
Anesthesia Evaluation  Patient identified by MRN, date of birth, ID band Patient awake    Airway Mallampati: I TM Distance: >3 FB Neck ROM: Full    Dental  (+) Dental Advisory Given, Upper Dentures and Lower Dentures   Pulmonary former smoker,  breath sounds clear to auscultation        Cardiovascular hypertension, Pt. on medications Rhythm:Regular Rate:Normal     Neuro/Psych    GI/Hepatic GERD-  Medicated and Controlled,  Endo/Other  diabetes, Well Controlled, Type 2, Oral Hypoglycemic AgentsMorbid obesity  Renal/GU      Musculoskeletal   Abdominal   Peds  Hematology   Anesthesia Other Findings   Reproductive/Obstetrics                           Anesthesia Physical Anesthesia Plan  ASA: III  Anesthesia Plan: General   Post-op Pain Management:    Induction: Intravenous  Airway Management Planned: LMA  Additional Equipment:   Intra-op Plan:   Post-operative Plan: Extubation in OR  Informed Consent: I have reviewed the patients History and Physical, chart, labs and discussed the procedure including the risks, benefits and alternatives for the proposed anesthesia with the patient or authorized representative who has indicated his/her understanding and acceptance.   Dental advisory given  Plan Discussed with: Anesthesiologist, CRNA and Surgeon  Anesthesia Plan Comments:         Anesthesia Quick Evaluation

## 2012-12-02 ENCOUNTER — Encounter (HOSPITAL_BASED_OUTPATIENT_CLINIC_OR_DEPARTMENT_OTHER): Payer: Self-pay | Admitting: Surgery

## 2012-12-02 NOTE — Op Note (Signed)
[name], [name] NO.:  [id number]  MEDICAL RECORD NO.:  [id number]  LOCATION:                                 FACILITY:  PHYSICIAN:  Abigail Miyamoto, M.D. DATE OF BIRTH:  [birth year]  DATE OF PROCEDURE:  11/29/2012 DATE OF DISCHARGE:                              OPERATIVE REPORT   PREOPERATIVE DIAGNOSIS:  Atypical ductal hyperplasia of the left breast.  POSTOPERATIVE DIAGNOSIS:  Atypical ductal hyperplasia of the left breast.  PROCEDURE:  Left breast needle localized lumpectomy.  SURGEON:  Abigail Miyamoto, MD  ANESTHESIA:  General and 0.5% Marcaine.  ESTIMATED BLOOD LOSS:  Minimal.  PROCEDURE IN DETAIL:  The patient was brought to the operating room, identified as [name].  She was placed supine on operating room table and anesthesia was induced.  Her left breast and localization wire were then prepped and draped in usual sterile fashion.  I made an elliptical incision around the wire.  I took this down to the breast tissue with electrocautery.  I then performed a wide lumpectomy with wide margins incorporating the localization of wire.  This did not go down to the chest wall, but went slightly underneath the lateral edge of the areola.  Once the lumpectomy was performed, x-ray confirmed that the suspicious area was lumpectomy specimen with wide margins.  I anesthetized the wound further with Marcaine.  Hemostasis was achieved with the cautery.  I closed the subcutaneous tissue with interrupted 3-0 Vicryl sutures and closed the skin with a running 4-0 Monocryl.  Steri- Strips, gauze, and Tegaderm were then applied.  The patient tolerated the procedure well.  All the counts were correct at the end of the procedure.  The patient was then extubated in the operating room and taken in stable condition to recovery room.     Abigail Miyamoto, M.D.     DB/MEDQ  D:  11/29/2012  T:  11/30/2012  Job:  [id number]

## 2012-12-10 ENCOUNTER — Telehealth (INDEPENDENT_AMBULATORY_CARE_PROVIDER_SITE_OTHER): Payer: Self-pay | Admitting: General Surgery

## 2012-12-10 NOTE — Telephone Encounter (Signed)
Patient calling stating she is still having a lot of shooting pains in the breast and it is sore. I made her aware that is not abnormal to have shooting pains after this type of surgery. I advised she try ibuprofen and a heating pad. She states no redness or problems with incision. She is asking about medication for her nerves. I advised her to contact her primary MD and she stated she did not have one. I encouraged her to find a PCP to talk about these problems. She will keep her follow up with Dr Magnus Ivan - and let us know if pains get worse prior to appt.

## 2012-12-23 ENCOUNTER — Encounter (HOSPITAL_COMMUNITY): Payer: Self-pay | Admitting: *Deleted

## 2012-12-24 ENCOUNTER — Encounter (INDEPENDENT_AMBULATORY_CARE_PROVIDER_SITE_OTHER): Payer: PRIVATE HEALTH INSURANCE | Admitting: Surgery

## 2013-01-08 ENCOUNTER — Ambulatory Visit: Payer: Self-pay | Admitting: Internal Medicine

## 2013-01-24 ENCOUNTER — Encounter (INDEPENDENT_AMBULATORY_CARE_PROVIDER_SITE_OTHER): Payer: Self-pay | Admitting: Surgery

## 2013-01-24 ENCOUNTER — Ambulatory Visit (INDEPENDENT_AMBULATORY_CARE_PROVIDER_SITE_OTHER): Payer: PRIVATE HEALTH INSURANCE | Admitting: Surgery

## 2013-01-24 VITALS — BP 127/83 | HR 68 | Temp 98.2°F | Resp 12 | Ht 64.5 in | Wt 213.2 lb

## 2013-01-24 DIAGNOSIS — Z09 Encounter for follow-up examination after completed treatment for conditions other than malignant neoplasm: Secondary | ICD-10-CM

## 2013-01-24 NOTE — Progress Notes (Signed)
Subjective:     Patient ID: [name], female   DOB: 01-14-1946, 67 y.o.   MRN: [id number]  HPI She is here for her first postop visit. She has no complaints.  Review of Systems     Objective:   Physical Exam On exam, the left breast incision is well healed.  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 She will continue her yearly mammograms. I will see her back as needed

## 2013-02-16 ENCOUNTER — Encounter (HOSPITAL_COMMUNITY): Payer: Self-pay | Admitting: *Deleted

## 2013-02-16 ENCOUNTER — Emergency Department (HOSPITAL_COMMUNITY): Payer: Medicare Other

## 2013-02-16 ENCOUNTER — Emergency Department (HOSPITAL_COMMUNITY)
Admission: EM | Admit: 2013-02-16 | Discharge: 2013-02-16 | Disposition: A | Discharge status: Home / Self Care | Payer: Medicare Other | Attending: Emergency Medicine | Admitting: Emergency Medicine

## 2013-02-16 DIAGNOSIS — Z8709 Personal history of other diseases of the respiratory system: Secondary | ICD-10-CM | POA: Insufficient documentation

## 2013-02-16 DIAGNOSIS — S52599A Other fractures of lower end of unspecified radius, initial encounter for closed fracture: Secondary | ICD-10-CM | POA: Insufficient documentation

## 2013-02-16 DIAGNOSIS — Z87891 Personal history of nicotine dependence: Secondary | ICD-10-CM | POA: Insufficient documentation

## 2013-02-16 DIAGNOSIS — Z98811 Dental restoration status: Secondary | ICD-10-CM | POA: Insufficient documentation

## 2013-02-16 DIAGNOSIS — E079 Disorder of thyroid, unspecified: Secondary | ICD-10-CM | POA: Insufficient documentation

## 2013-02-16 DIAGNOSIS — I10 Essential (primary) hypertension: Secondary | ICD-10-CM | POA: Insufficient documentation

## 2013-02-16 DIAGNOSIS — R296 Repeated falls: Secondary | ICD-10-CM | POA: Insufficient documentation

## 2013-02-16 DIAGNOSIS — Z862 Personal history of diseases of the blood and blood-forming organs and certain disorders involving the immune mechanism: Secondary | ICD-10-CM | POA: Insufficient documentation

## 2013-02-16 DIAGNOSIS — E785 Hyperlipidemia, unspecified: Secondary | ICD-10-CM | POA: Insufficient documentation

## 2013-02-16 DIAGNOSIS — Z7902 Long term (current) use of antithrombotics/antiplatelets: Secondary | ICD-10-CM | POA: Insufficient documentation

## 2013-02-16 DIAGNOSIS — Z8719 Personal history of other diseases of the digestive system: Secondary | ICD-10-CM | POA: Insufficient documentation

## 2013-02-16 DIAGNOSIS — Z79899 Other long term (current) drug therapy: Secondary | ICD-10-CM | POA: Insufficient documentation

## 2013-02-16 DIAGNOSIS — Y92009 Unspecified place in unspecified non-institutional (private) residence as the place of occurrence of the external cause: Secondary | ICD-10-CM | POA: Insufficient documentation

## 2013-02-16 DIAGNOSIS — Z8669 Personal history of other diseases of the nervous system and sense organs: Secondary | ICD-10-CM | POA: Insufficient documentation

## 2013-02-16 DIAGNOSIS — E119 Type 2 diabetes mellitus without complications: Secondary | ICD-10-CM | POA: Insufficient documentation

## 2013-02-16 DIAGNOSIS — Z8673 Personal history of transient ischemic attack (TIA), and cerebral infarction without residual deficits: Secondary | ICD-10-CM | POA: Insufficient documentation

## 2013-02-16 DIAGNOSIS — S62102A Fracture of unspecified carpal bone, left wrist, initial encounter for closed fracture: Secondary | ICD-10-CM

## 2013-02-16 DIAGNOSIS — Y9389 Activity, other specified: Secondary | ICD-10-CM | POA: Insufficient documentation

## 2013-02-16 DIAGNOSIS — Z794 Long term (current) use of insulin: Secondary | ICD-10-CM | POA: Insufficient documentation

## 2013-02-16 DIAGNOSIS — I509 Heart failure, unspecified: Secondary | ICD-10-CM | POA: Insufficient documentation

## 2013-02-16 MED ORDER — OXYCODONE-ACETAMINOPHEN 5-325 MG PO TABS
1.0000 | ORAL_TABLET | Freq: Once | ORAL | Status: AC
  Administered 2013-02-16: 1 via ORAL
  Filled 2013-02-16: qty 1

## 2013-02-16 MED ORDER — ONDANSETRON HCL 4 MG/2ML IJ SOLN
4.0000 mg | Freq: Once | INTRAMUSCULAR | Status: AC
  Administered 2013-02-16: 4 mg via INTRAVENOUS
  Filled 2013-02-16: qty 2

## 2013-02-16 MED ORDER — OXYCODONE-ACETAMINOPHEN 5-325 MG PO TABS: 1.0000 | ORAL_TABLET | Freq: Four times a day (QID) | ORAL | Status: DC | PRN

## 2013-02-16 MED ORDER — FENTANYL CITRATE 0.05 MG/ML IJ SOLN
50.0000 ug | INTRAMUSCULAR | Status: DC | PRN
  Administered 2013-02-16: 50 ug via INTRAVENOUS
  Filled 2013-02-16: qty 2

## 2013-02-16 NOTE — ED Provider Notes (Signed)
History     CSN: [id number]  Arrival date & time 02/16/13  0115   First MD Initiated Contact with Patient 02/16/13 0244      Chief Complaint  Patient presents with  . Wrist Pain    (Consider location/radiation/quality/duration/timing/severity/associated sxs/prior treatment) HPI History provided by the patient. At home tonight had a mechanical fall onto extended left upper extremity injuring her left wrist. Brought in by EMS with left wrist deformity. Patient denies any other pain injury or trauma. Pain is sharp in quality and not radiating from her left wrist. Pain is moderate to severe. Improved with fentanyl in route. Hurts to move her wrist. No weakness or numbness.   Past Medical History  Diagnosis Date  . Diabetes mellitus   . Sickle cell anemia   . Hyperlipidemia   . Thyroid disease   . Hypertension   . Ear infection   . Sinus problem   . Stroke   . CHF (congestive heart failure)   . Diverticula, intestine   . Complication of anesthesia     had bronch spasms post bladder surgery-had neb tx-that was the only time this happened  . Wears glasses   . Wears dentures     top-partial lower    Past Surgical History  Procedure Laterality Date  . Lipoma resection  July 2012    8cm, 4cm Left neck, right upper back  . Abdominal hysterectomy    . Bladder suspension    . Bilateral oophorectomy    . Thyroidectomy, partial  1985  . Breast lumpectomy with needle localization Left 11/29/2012    Procedure: BREAST LUMPECTOMY WITH NEEDLE LOCALIZATION;  Surgeon: Shelly Rubenstein, MD;  Location:  SURGERY CENTER;  Service: General;  Laterality: Left;    Family History  Problem Relation Age of Onset  . Heart disease Mother   . Hypertension Mother   . Diabetes Mother   . Heart disease Father   . Hypertension Father   . Diabetes Father   . Heart disease Sister   . Hypertension Sister   . Diabetes Sister   . Hypertension Brother   . Cancer Brother   . Diabetes  Sister   . Hypertension Sister   . Diabetes Sister   . Hypertension Sister     History  Substance Use Topics  . Smoking status: Former Smoker    Quit date: 07/11/2005  . Smokeless tobacco: Never Used  . Alcohol Use: Yes     Comment: occasional    OB History   Grav Para Term Preterm Abortions TAB SAB Ect Mult Living   1 1 1       1       Review of Systems  Constitutional: Negative for fever and chills.  HENT: Negative for neck pain and neck stiffness.   Eyes: Negative for visual disturbance.  Respiratory: Negative for shortness of breath.   Cardiovascular: Negative for chest pain.  Gastrointestinal: Negative for abdominal pain.  Genitourinary: Negative for flank pain.  Musculoskeletal: Negative for back pain.  Skin: Negative for rash.  Neurological: Negative for headaches.  All other systems reviewed and are negative.    Allergies  Sulfonamide derivatives and Codeine  Home Medications   Current Outpatient Rx  Name  Route  Sig  Dispense  Refill  . amLODipine (NORVASC) 10 MG tablet   Oral   Take 1 tablet (10 mg total) by mouth daily.   30 tablet   3   . clopidogrel (PLAVIX) 75 MG tablet  Oral   Take 1 tablet (75 mg total) by mouth daily.   30 tablet   3   . glimepiride (AMARYL) 4 MG tablet   Oral   Take 1 tablet (4 mg total) by mouth daily before breakfast.   30 tablet   3   . hydrochlorothiazide (HYDRODIURIL) 25 MG tablet   Oral   Take 1 tablet (25 mg total) by mouth daily.   30 tablet   3   . insulin glargine (LANTUS) 100 UNIT/ML injection   Subcutaneous   Inject 50 Units into the skin daily.   10 mL   3   . irbesartan (AVAPRO) 150 MG tablet   Oral   Take 150 mg by mouth daily.         . pantoprazole (PROTONIX) 40 MG tablet   Oral   Take 40 mg by mouth daily.         . pravastatin (PRAVACHOL) 10 MG tablet   Oral   Take 20 mg by mouth at bedtime.          Marland Kitchen SYNTHROID 112 MCG tablet   Oral   Take 1 tablet (112 mcg total) by  mouth daily.   30 tablet   3     Dispense as written.     BP 137/68  Pulse 87  Temp(Src) 98.1 F (36.7 C) (Oral)  Resp 18  SpO2 96%  Physical Exam  Nursing note and vitals reviewed. Constitutional: She is oriented to person, place, and time. She appears well-developed and well-nourished.  HENT:  Head: Normocephalic and atraumatic.  Eyes: EOM are normal. Pupils are equal, round, and reactive to light.  Neck: Neck supple.  Cardiovascular: Normal heart sounds and intact distal pulses.   Pulmonary/Chest: Effort normal. No respiratory distress.  Abdominal: Soft. There is no tenderness.  Musculoskeletal:   Left upper extremity with tenderness, swelling and deformity to left wrist with skin intact throughout. Distal neurovascular intact. Nontender elbow, shoulder and clavicle.  Neurological: She is alert and oriented to person, place, and time.  Skin: Skin is warm and dry.    ED Course  Procedures (including critical care time)  Labs Reviewed - No data to display Dg Forearm Left  02/16/2013   *RADIOLOGY REPORT*  Clinical Data: Pain post fall  LEFT FOREARM - 2 VIEW  Comparison: None.  Findings: Transverse comminuted fracture of the distal radius with extension to the distal radial articular surface with 2 mm distraction  of the subchondral cortex fragments.  There is neutral angulation of the distal radial articular surface.  There is a minimally-displaced ulnar styloid fracture.  Carpal rows appear intact.  IMPRESSION:    1.  Comminuted intra-articular distal radial fracture with neutral angulation of the distal radial articular surface.  2.  Ulnar styloid avulsion fracture.   Original Report Authenticated By: D. Andria Rhein, MD   Dg Wrist Complete Left  02/16/2013   *RADIOLOGY REPORT*  Clinical Data: Pain post fall  LEFT WRIST - COMPLETE 3+ VIEW  Comparison: None.  Findings: Transverse comminuted fracture of the distal radius with extension to the distal radial articular surface but  no significant distraction or step-off deformity of the subchondral cortex.  There is neutral angulation of the distal radial articular surface. There is a minimally-displaced ulnar styloid fracture.  Carpal rows appear intact.  IMPRESSION:  1.  Comminuted intra-articular distal radial fracture with neutral angulation of the distal radial articular surface. 2.  Ulnar styloid avulsion fracture.   Original Report  Authenticated By: D. Andria Rhein, MD   IV fentanyl, ice  3:32 AM I assisted ortho tech with splint placement and post splinting distal neurovascular intact. Plan f/u Hand Dr Orlan Leavens. RX pain medications, sling, ice and elevation  MDM  Fall with closed left wrist fracture IV narcotics pain control X-ray reviewed as above Splint placed with sling Vital signs and nursing notes reviewed and considered        Sunnie Nielsen, MD 02/16/13 [phone]

## 2013-02-16 NOTE — ED Notes (Addendum)
Pt was attempting to kill an insect, fell and landed on her wrist.  150 mg of fentanyl given total by EMS.  Pt denies dizziness or LOC prior to or after the fall.

## 2013-02-18 ENCOUNTER — Encounter (HOSPITAL_COMMUNITY): Payer: Self-pay | Admitting: Pharmacy Technician

## 2013-02-21 NOTE — Pre-Procedure Instructions (Signed)
Mira Balon Kinnard  02/21/2013   Your procedure is scheduled on: Wednesday, June 25th   Report to [facility] at 11:45 AM.             Come through Entrance "A", follow signs to EAST elevators and go to 3rd floor.   Call this number if you have problems the morning of surgery: [phone]   Remember:   Do not eat food or drink liquids after midnight Tuesday.   Take these medicines the morning of surgery with A SIP OF WATER: Norvasc, Oxycodone, Protonix, Synthroid   Do not wear jewelry, make-up or nail polish.  Do not wear lotions, powders, or perfumes. You may NOT wear deodorant.  Do not shave underarms & legs 48 hours prior to surgery.    Do not bring valuables to the hospital.  [facility] is not responsible for any belongings or valuables.  Contacts, dentures or bridgework may not be worn into surgery.   Leave suitcase in the car. After surgery it may be brought to your room.  For patients admitted to the hospital, checkout time is 11:00 AM the day of discharge.   Patients discharged the day of surgery will not be allowed to drive home, and if discharged will need someone to stay with them for the first 24 hrs.   Name and phone number of your driver:    Special Instructions: Shower using CHG 2 nights before surgery and the night before surgery.  If you shower the day of surgery use CHG.  Use special wash - you have one bottle of CHG for all showers.  You should use approximately 1/3 of the bottle for each shower.   Please read over the following fact sheets that you were given: Pain Booklet, Coughing and Deep Breathing, MRSA Information and Surgical Site Infection Prevention

## 2013-02-24 ENCOUNTER — Encounter (HOSPITAL_COMMUNITY)
Admission: RE | Admit: 2013-02-24 | Discharge: 2013-02-24 | Disposition: A | Discharge status: Home / Self Care | Payer: Medicare Other | Source: Ambulatory Visit | Attending: Anesthesiology | Admitting: Anesthesiology

## 2013-02-24 ENCOUNTER — Encounter (HOSPITAL_COMMUNITY)
Admission: RE | Admit: 2013-02-24 | Discharge: 2013-02-24 | Disposition: A | Discharge status: Home / Self Care | Payer: Medicare Other | Source: Ambulatory Visit | Attending: Orthopedic Surgery | Admitting: Orthopedic Surgery

## 2013-02-24 ENCOUNTER — Encounter (HOSPITAL_COMMUNITY): Payer: Self-pay

## 2013-02-24 HISTORY — DX: Gastro-esophageal reflux disease without esophagitis: K21.9

## 2013-02-24 HISTORY — DX: Depression, unspecified: F32.A

## 2013-02-24 HISTORY — DX: Other specified postprocedural states: R11.2

## 2013-02-24 HISTORY — DX: Anxiety disorder, unspecified: F41.9

## 2013-02-24 HISTORY — DX: Other specified postprocedural states: Z98.890

## 2013-02-24 HISTORY — DX: Hypothyroidism, unspecified: E03.9

## 2013-02-24 HISTORY — DX: Major depressive disorder, single episode, unspecified: F32.9

## 2013-02-24 LAB — CBC
Hemoglobin: 12.9 g/dL (ref 12.0–15.0)
Platelets: 236 10*3/uL (ref 150–400)
RBC: 4.55 MIL/uL (ref 3.87–5.11)
WBC: 7.4 10*3/uL (ref 4.0–10.5)

## 2013-02-24 LAB — BASIC METABOLIC PANEL
Calcium: 9 mg/dL (ref 8.4–10.5)
GFR calc Af Amer: >90 mL/min (ref >90)
GFR calc non Af Amer: 88 mL/min — ABNORMAL LOW (ref >90)
Glucose, Bld: 123 mg/dL — ABNORMAL HIGH (ref 70–99)
Potassium: 2.9 mEq/L — ABNORMAL LOW (ref 3.5–5.1)
Sodium: 138 mEq/L (ref 135–145)

## 2013-02-24 LAB — SURGICAL PCR SCREEN
MRSA, PCR: NEGATIVE
Staphylococcus aureus: NEGATIVE

## 2013-02-24 NOTE — Progress Notes (Signed)
Primary Physician - Dr. Loleta [facility] Does not have a cardiologist ekg in epic from July 2013

## 2013-02-24 NOTE — Progress Notes (Signed)
This is a 67 year old female who is scheduled for ORIF with possible bone grafting of the left wrist to be performed by Dr. Sharma Covert on Wednesday 26 February 2013. This patient was seen in the Short Stay Pre-admission Clinic on Monday 24 February 2013 in preparation for her surgery.   Lab results obtained on 24 February 2013 show her to be hypokalemic at 2.9 (3.5-5.1). This patient is currently on HCTZ 25mg  daily for HTN/CHF.   I contacted Dr. Glenna Durand office to report this low potassium level.   An Sharen Hones has been ordered for DOS.  May proceed with surgery as scheduled.   Kelton Pillar. Kairee Isa, PA-C

## 2013-02-24 NOTE — Progress Notes (Signed)
istat will be repeated DOS. Per [facility]

## 2013-02-26 ENCOUNTER — Encounter (HOSPITAL_COMMUNITY)
Admission: RE | Disposition: A | Discharge status: Home / Self Care | Payer: Self-pay | Source: Ambulatory Visit | Attending: Orthopedic Surgery

## 2013-02-26 ENCOUNTER — Encounter (HOSPITAL_COMMUNITY): Payer: Self-pay | Admitting: Certified Registered"

## 2013-02-26 ENCOUNTER — Ambulatory Visit (HOSPITAL_COMMUNITY)
Admission: RE | Admit: 2013-02-26 | Discharge: 2013-02-27 | Disposition: A | Discharge status: Home / Self Care | Payer: Medicare Other | Source: Ambulatory Visit | Attending: Orthopedic Surgery | Admitting: Orthopedic Surgery

## 2013-02-26 ENCOUNTER — Ambulatory Visit (HOSPITAL_COMMUNITY): Payer: Medicare Other | Admitting: Anesthesiology

## 2013-02-26 ENCOUNTER — Encounter (HOSPITAL_COMMUNITY): Payer: Self-pay | Admitting: Anesthesiology

## 2013-02-26 DIAGNOSIS — E876 Hypokalemia: Secondary | ICD-10-CM | POA: Insufficient documentation

## 2013-02-26 DIAGNOSIS — W19XXXA Unspecified fall, initial encounter: Secondary | ICD-10-CM | POA: Insufficient documentation

## 2013-02-26 DIAGNOSIS — D573 Sickle-cell trait: Secondary | ICD-10-CM | POA: Insufficient documentation

## 2013-02-26 DIAGNOSIS — I509 Heart failure, unspecified: Secondary | ICD-10-CM | POA: Insufficient documentation

## 2013-02-26 DIAGNOSIS — S52509A Unspecified fracture of the lower end of unspecified radius, initial encounter for closed fracture: Secondary | ICD-10-CM | POA: Insufficient documentation

## 2013-02-26 DIAGNOSIS — Z01818 Encounter for other preprocedural examination: Secondary | ICD-10-CM | POA: Insufficient documentation

## 2013-02-26 DIAGNOSIS — E119 Type 2 diabetes mellitus without complications: Secondary | ICD-10-CM | POA: Insufficient documentation

## 2013-02-26 DIAGNOSIS — S52609A Unspecified fracture of lower end of unspecified ulna, initial encounter for closed fracture: Secondary | ICD-10-CM | POA: Insufficient documentation

## 2013-02-26 DIAGNOSIS — Z01812 Encounter for preprocedural laboratory examination: Secondary | ICD-10-CM | POA: Insufficient documentation

## 2013-02-26 DIAGNOSIS — Z794 Long term (current) use of insulin: Secondary | ICD-10-CM | POA: Insufficient documentation

## 2013-02-26 DIAGNOSIS — I10 Essential (primary) hypertension: Secondary | ICD-10-CM | POA: Insufficient documentation

## 2013-02-26 DIAGNOSIS — Z79899 Other long term (current) drug therapy: Secondary | ICD-10-CM | POA: Insufficient documentation

## 2013-02-26 HISTORY — PX: ORIF WRIST FRACTURE: SHX2133

## 2013-02-26 HISTORY — PX: ORIF DISTAL RADIUS FRACTURE: SUR927

## 2013-02-26 LAB — POCT I-STAT 4, (NA,K, GLUC, HGB,HCT)
Glucose, Bld: 98 mg/dL (ref 70–99)
HCT: 40 % (ref 36.0–46.0)
Hemoglobin: 13.6 g/dL (ref 12.0–15.0)
Potassium: 3.2 mEq/L — ABNORMAL LOW (ref 3.5–5.1)

## 2013-02-26 LAB — GLUCOSE, CAPILLARY
Glucose-Capillary: 101 mg/dL — ABNORMAL HIGH (ref 70–99)
Glucose-Capillary: 213 mg/dL — ABNORMAL HIGH (ref 70–99)
Glucose-Capillary: 82 mg/dL (ref 70–99)

## 2013-02-26 SURGERY — OPEN REDUCTION INTERNAL FIXATION (ORIF) WRIST FRACTURE
Anesthesia: Regional | Site: Wrist | Laterality: Left | Wound class: Clean Contaminated

## 2013-02-26 MED ORDER — CLOPIDOGREL BISULFATE 75 MG PO TABS
75.0000 mg | ORAL_TABLET | Freq: Every day | ORAL | Status: DC
  Administered 2013-02-27: 75 mg via ORAL
  Filled 2013-02-26 (×2): qty 1

## 2013-02-26 MED ORDER — CHLORHEXIDINE GLUCONATE 4 % EX LIQD: 60.0000 mL | Freq: Once | CUTANEOUS | Status: DC

## 2013-02-26 MED ORDER — CEFAZOLIN SODIUM 1-5 GM-% IV SOLN
1.0000 g | Freq: Three times a day (TID) | INTRAVENOUS | Status: DC
  Administered 2013-02-27 (×2): 1 g via INTRAVENOUS
  Filled 2013-02-26 (×4): qty 50

## 2013-02-26 MED ORDER — AMLODIPINE BESYLATE 10 MG PO TABS
10.0000 mg | ORAL_TABLET | Freq: Every day | ORAL | Status: DC
  Administered 2013-02-27: 10 mg via ORAL
  Filled 2013-02-26 (×2): qty 1

## 2013-02-26 MED ORDER — CEFAZOLIN SODIUM-DEXTROSE 2-3 GM-% IV SOLR
INTRAVENOUS | Status: AC
  Filled 2013-02-26: qty 50

## 2013-02-26 MED ORDER — IRBESARTAN 150 MG PO TABS
150.0000 mg | ORAL_TABLET | Freq: Every day | ORAL | Status: DC
  Administered 2013-02-27: 150 mg via ORAL
  Filled 2013-02-26 (×2): qty 1

## 2013-02-26 MED ORDER — KCL IN DEXTROSE-NACL 20-5-0.45 MEQ/L-%-% IV SOLN
INTRAVENOUS | Status: DC
  Administered 2013-02-26: 23:00:00 via INTRAVENOUS
  Filled 2013-02-26 (×3): qty 1000

## 2013-02-26 MED ORDER — CEFAZOLIN SODIUM-DEXTROSE 2-3 GM-% IV SOLR
2.0000 g | INTRAVENOUS | Status: AC
  Administered 2013-02-26: 2 g via INTRAVENOUS

## 2013-02-26 MED ORDER — FENTANYL CITRATE 0.05 MG/ML IJ SOLN
INTRAMUSCULAR | Status: AC
  Administered 2013-02-26: 50 ug via INTRAVENOUS
  Filled 2013-02-26: qty 2

## 2013-02-26 MED ORDER — OXYCODONE-ACETAMINOPHEN 5-325 MG PO TABS
1.0000 | ORAL_TABLET | ORAL | Status: DC | PRN
  Administered 2013-02-27 (×2): 2 via ORAL
  Filled 2013-02-26 (×2): qty 2

## 2013-02-26 MED ORDER — FENTANYL CITRATE 0.05 MG/ML IJ SOLN
25.0000 ug | INTRAMUSCULAR | Status: DC | PRN
  Administered 2013-02-26: 50 ug via INTRAVENOUS

## 2013-02-26 MED ORDER — STERILE WATER FOR IRRIGATION IR SOLN
Status: DC | PRN
  Administered 2013-02-26: 1000 mL

## 2013-02-26 MED ORDER — METHOCARBAMOL 100 MG/ML IJ SOLN
500.0000 mg | Freq: Four times a day (QID) | INTRAVENOUS | Status: DC | PRN
  Filled 2013-02-26: qty 5

## 2013-02-26 MED ORDER — VITAMIN C 500 MG PO TABS
1000.0000 mg | ORAL_TABLET | Freq: Every day | ORAL | Status: DC
  Administered 2013-02-27: 1000 mg via ORAL
  Filled 2013-02-26: qty 2

## 2013-02-26 MED ORDER — LEVOTHYROXINE SODIUM 112 MCG PO TABS
112.0000 ug | ORAL_TABLET | Freq: Every day | ORAL | Status: DC
  Administered 2013-02-27: 112 ug via ORAL
  Filled 2013-02-26 (×2): qty 1

## 2013-02-26 MED ORDER — SIMVASTATIN 10 MG PO TABS
10.0000 mg | ORAL_TABLET | Freq: Every day | ORAL | Status: DC
  Filled 2013-02-26: qty 1

## 2013-02-26 MED ORDER — INSULIN ASPART 100 UNIT/ML ~~LOC~~ SOLN
0.0000 [IU] | SUBCUTANEOUS | Status: DC
  Administered 2013-02-26 – 2013-02-27 (×2): 7 [IU] via SUBCUTANEOUS
  Administered 2013-02-27: 4 [IU] via SUBCUTANEOUS
  Administered 2013-02-27: 11 [IU] via SUBCUTANEOUS

## 2013-02-26 MED ORDER — DOCUSATE SODIUM 100 MG PO CAPS: 100.0000 mg | ORAL_CAPSULE | Freq: Two times a day (BID) | ORAL | Status: AC

## 2013-02-26 MED ORDER — CEFAZOLIN SODIUM 1-5 GM-% IV SOLN
1.0000 g | INTRAVENOUS | Status: AC
  Administered 2013-02-26: 1 g via INTRAVENOUS
  Filled 2013-02-26: qty 50

## 2013-02-26 MED ORDER — ZOLPIDEM TARTRATE 5 MG PO TABS: 5.0000 mg | ORAL_TABLET | Freq: Every evening | ORAL | Status: DC | PRN

## 2013-02-26 MED ORDER — OXYCODONE-ACETAMINOPHEN 5-325 MG PO TABS
ORAL_TABLET | ORAL | Status: AC
  Administered 2013-02-26: 2 via ORAL
  Filled 2013-02-26: qty 2

## 2013-02-26 MED ORDER — ONDANSETRON HCL 4 MG PO TABS: 4.0000 mg | ORAL_TABLET | Freq: Four times a day (QID) | ORAL | Status: DC | PRN

## 2013-02-26 MED ORDER — MIDAZOLAM HCL 5 MG/5ML IJ SOLN
INTRAMUSCULAR | Status: DC | PRN
  Administered 2013-02-26: 1 mg via INTRAVENOUS

## 2013-02-26 MED ORDER — DIPHENHYDRAMINE HCL 25 MG PO CAPS: 25.0000 mg | ORAL_CAPSULE | Freq: Four times a day (QID) | ORAL | Status: DC | PRN

## 2013-02-26 MED ORDER — PROPOFOL 10 MG/ML IV BOLUS
INTRAVENOUS | Status: DC | PRN
  Administered 2013-02-26: 200 mg via INTRAVENOUS

## 2013-02-26 MED ORDER — GLIMEPIRIDE 4 MG PO TABS
4.0000 mg | ORAL_TABLET | Freq: Every day | ORAL | Status: DC
  Administered 2013-02-27: 4 mg via ORAL
  Filled 2013-02-26 (×2): qty 1

## 2013-02-26 MED ORDER — VITAMIN C 500 MG PO TABS: 500.0000 mg | ORAL_TABLET | Freq: Every day | ORAL | Status: DC

## 2013-02-26 MED ORDER — ONDANSETRON HCL 4 MG/2ML IJ SOLN: 4.0000 mg | Freq: Four times a day (QID) | INTRAMUSCULAR | Status: AC | PRN

## 2013-02-26 MED ORDER — BUPIVACAINE HCL (PF) 0.25 % IJ SOLN
INTRAMUSCULAR | Status: DC | PRN
  Administered 2013-02-26: 30 mL

## 2013-02-26 MED ORDER — METHOCARBAMOL 500 MG PO TABS
ORAL_TABLET | ORAL | Status: AC
  Administered 2013-02-26: 500 mg via ORAL
  Filled 2013-02-26: qty 1

## 2013-02-26 MED ORDER — ADULT MULTIVITAMIN W/MINERALS CH
1.0000 | ORAL_TABLET | Freq: Every day | ORAL | Status: DC
  Administered 2013-02-27: 1 via ORAL
  Filled 2013-02-26: qty 1

## 2013-02-26 MED ORDER — INSULIN GLARGINE 100 UNIT/ML ~~LOC~~ SOLN
50.0000 [IU] | Freq: Every day | SUBCUTANEOUS | Status: DC
  Administered 2013-02-27: 50 [IU] via SUBCUTANEOUS
  Filled 2013-02-26 (×2): qty 0.5

## 2013-02-26 MED ORDER — LIDOCAINE HCL (CARDIAC) 20 MG/ML IV SOLN
INTRAVENOUS | Status: DC | PRN
  Administered 2013-02-26: 50 mg via INTRAVENOUS

## 2013-02-26 MED ORDER — DOCUSATE SODIUM 100 MG PO CAPS
100.0000 mg | ORAL_CAPSULE | Freq: Two times a day (BID) | ORAL | Status: DC
  Administered 2013-02-26 – 2013-02-27 (×2): 100 mg via ORAL
  Filled 2013-02-26 (×2): qty 1

## 2013-02-26 MED ORDER — MIDAZOLAM HCL 2 MG/2ML IJ SOLN
INTRAMUSCULAR | Status: AC
  Administered 2013-02-26: 2 mg
  Filled 2013-02-26: qty 2

## 2013-02-26 MED ORDER — HYDROCHLOROTHIAZIDE 25 MG PO TABS
25.0000 mg | ORAL_TABLET | Freq: Every day | ORAL | Status: DC
  Administered 2013-02-27: 25 mg via ORAL
  Filled 2013-02-26 (×2): qty 1

## 2013-02-26 MED ORDER — HYDROMORPHONE HCL PF 1 MG/ML IJ SOLN
0.2500 mg | INTRAMUSCULAR | Status: DC | PRN
  Administered 2013-02-26: 0.25 mg via INTRAVENOUS

## 2013-02-26 MED ORDER — ONDANSETRON HCL 4 MG/2ML IJ SOLN
INTRAMUSCULAR | Status: AC
  Administered 2013-02-26: 4 mg via INTRAVENOUS
  Filled 2013-02-26: qty 2

## 2013-02-26 MED ORDER — ONDANSETRON HCL 4 MG/2ML IJ SOLN
INTRAMUSCULAR | Status: DC | PRN
  Administered 2013-02-26: 4 mg via INTRAVENOUS

## 2013-02-26 MED ORDER — METHOCARBAMOL 500 MG PO TABS: 500.0000 mg | ORAL_TABLET | Freq: Four times a day (QID) | ORAL | Status: DC

## 2013-02-26 MED ORDER — FENTANYL CITRATE 0.05 MG/ML IJ SOLN
INTRAMUSCULAR | Status: AC
  Administered 2013-02-26: 100 ug
  Filled 2013-02-26: qty 2

## 2013-02-26 MED ORDER — MORPHINE SULFATE 2 MG/ML IJ SOLN
1.0000 mg | INTRAMUSCULAR | Status: DC | PRN
  Administered 2013-02-26 – 2013-02-27 (×3): 1 mg via INTRAVENOUS
  Filled 2013-02-26 (×3): qty 1

## 2013-02-26 MED ORDER — OXYCODONE-ACETAMINOPHEN 10-325 MG PO TABS: 1.0000 | ORAL_TABLET | ORAL | Status: DC | PRN

## 2013-02-26 MED ORDER — PANTOPRAZOLE SODIUM 40 MG PO TBEC
40.0000 mg | DELAYED_RELEASE_TABLET | Freq: Every day | ORAL | Status: DC
  Administered 2013-02-27: 40 mg via ORAL
  Filled 2013-02-26: qty 1

## 2013-02-26 MED ORDER — METHOCARBAMOL 500 MG PO TABS
500.0000 mg | ORAL_TABLET | Freq: Four times a day (QID) | ORAL | Status: DC | PRN
  Administered 2013-02-27 (×2): 500 mg via ORAL
  Filled 2013-02-26 (×2): qty 1

## 2013-02-26 MED ORDER — BUPIVACAINE HCL (PF) 0.25 % IJ SOLN
INTRAMUSCULAR | Status: AC
  Filled 2013-02-26: qty 30

## 2013-02-26 MED ORDER — BUPIVACAINE-EPINEPHRINE PF 0.5-1:200000 % IJ SOLN
INTRAMUSCULAR | Status: DC | PRN
  Administered 2013-02-26: 30 mL

## 2013-02-26 MED ORDER — FENTANYL CITRATE 0.05 MG/ML IJ SOLN
INTRAMUSCULAR | Status: DC | PRN
  Administered 2013-02-26 (×5): 50 ug via INTRAVENOUS

## 2013-02-26 MED ORDER — HYDROMORPHONE HCL PF 1 MG/ML IJ SOLN
INTRAMUSCULAR | Status: AC
  Administered 2013-02-26: 0.25 mg via INTRAVENOUS
  Filled 2013-02-26: qty 1

## 2013-02-26 MED ORDER — HYDROCODONE-ACETAMINOPHEN 7.5-325 MG PO TABS
1.0000 | ORAL_TABLET | ORAL | Status: DC | PRN
  Administered 2013-02-27 (×2): 2 via ORAL
  Filled 2013-02-26 (×2): qty 2

## 2013-02-26 MED ORDER — ONDANSETRON HCL 4 MG/2ML IJ SOLN: 4.0000 mg | Freq: Four times a day (QID) | INTRAMUSCULAR | Status: DC | PRN

## 2013-02-26 MED ORDER — DEXTROSE 50 % IV SOLN
INTRAVENOUS | Status: AC
  Filled 2013-02-26: qty 50

## 2013-02-26 MED ORDER — LACTATED RINGERS IV SOLN
INTRAVENOUS | Status: DC
  Administered 2013-02-26 (×2): via INTRAVENOUS

## 2013-02-26 SURGICAL SUPPLY — 62 items
BANDAGE ELASTIC 3 VELCRO ST LF (GAUZE/BANDAGES/DRESSINGS) ×2 IMPLANT
BANDAGE ELASTIC 4 VELCRO ST LF (GAUZE/BANDAGES/DRESSINGS) ×2 IMPLANT
BANDAGE GAUZE ELAST BULKY 4 IN (GAUZE/BANDAGES/DRESSINGS) ×2 IMPLANT
BIT DRILL 2.2 SS TIBIAL (BIT) ×1 IMPLANT
BLADE SURG ROTATE 9660 (MISCELLANEOUS) IMPLANT
BNDG CMPR 9X4 STRL LF SNTH (GAUZE/BANDAGES/DRESSINGS) ×1
BNDG ESMARK 4X9 LF (GAUZE/BANDAGES/DRESSINGS) ×2 IMPLANT
CLOTH BEACON ORANGE TIMEOUT ST (SAFETY) ×2 IMPLANT
CORDS BIPOLAR (ELECTRODE) ×2 IMPLANT
COVER SURGICAL LIGHT HANDLE (MISCELLANEOUS) ×2 IMPLANT
CUFF TOURNIQUET SINGLE 18IN (TOURNIQUET CUFF) ×2 IMPLANT
CUFF TOURNIQUET SINGLE 24IN (TOURNIQUET CUFF) IMPLANT
DRAIN TLS ROUND 10FR (DRAIN) IMPLANT
DRAPE OEC MINIVIEW 54X84 (DRAPES) ×2 IMPLANT
DRAPE SURG 17X11 SM STRL (DRAPES) ×2 IMPLANT
DRSG ADAPTIC 3X8 NADH LF (GAUZE/BANDAGES/DRESSINGS) ×2 IMPLANT
ELECT REM PT RETURN 9FT ADLT (ELECTROSURGICAL)
ELECTRODE REM PT RTRN 9FT ADLT (ELECTROSURGICAL) IMPLANT
GLOVE BIOGEL PI IND STRL 8.5 (GLOVE) ×1 IMPLANT
GLOVE BIOGEL PI INDICATOR 8.5 (GLOVE) ×1
GLOVE SURG ORTHO 8.0 STRL STRW (GLOVE) ×2 IMPLANT
GOWN PREVENTION PLUS XLARGE (GOWN DISPOSABLE) ×2 IMPLANT
GOWN STRL NON-REIN LRG LVL3 (GOWN DISPOSABLE) ×6 IMPLANT
K-WIRE 1.6 (WIRE) ×2
K-WIRE FX5X1.6XNS BN SS (WIRE) ×1
KIT BASIN OR (CUSTOM PROCEDURE TRAY) ×2 IMPLANT
KIT ROOM TURNOVER OR (KITS) ×2 IMPLANT
KWIRE FX5X1.6XNS BN SS (WIRE) IMPLANT
MANIFOLD NEPTUNE II (INSTRUMENTS) ×2 IMPLANT
NDL HYPO 25X1 1.5 SAFETY (NEEDLE) ×1 IMPLANT
NEEDLE HYPO 25X1 1.5 SAFETY (NEEDLE) ×2 IMPLANT
NS IRRIG 1000ML POUR BTL (IV SOLUTION) ×2 IMPLANT
PACK ORTHO EXTREMITY (CUSTOM PROCEDURE TRAY) ×2 IMPLANT
PAD ARMBOARD 7.5X6 YLW CONV (MISCELLANEOUS) ×4 IMPLANT
PAD CAST 4YDX4 CTTN HI CHSV (CAST SUPPLIES) ×1 IMPLANT
PADDING CAST ABS 3INX4YD NS (CAST SUPPLIES) ×1
PADDING CAST ABS COTTON 3X4 (CAST SUPPLIES) IMPLANT
PADDING CAST COTTON 4X4 STRL (CAST SUPPLIES) ×2
PEG LOCKING SMOOTH 2.2X20 (Screw) ×2 IMPLANT
PEG LOCKING SMOOTH 2.2X22 (Screw) ×3 IMPLANT
PLATE STANDARD DVR LEFT (Plate) ×2 IMPLANT
PLATE STD DVR LT 24X51 (Plate) IMPLANT
SCREW 2.7X12MM (Screw) ×1 IMPLANT
SCREW 2.7X14MM (Screw) ×2 IMPLANT
SCREW BN 14X2.7XNONLOCK 3 LD (Screw) IMPLANT
SCREW LOCK 14X2.7X 3 LD TPR (Screw) IMPLANT
SCREW LOCKING 2.7X14 (Screw) ×4 IMPLANT
SCREW LOCKING 2.7X8MM (Screw) ×1 IMPLANT
SCREW MULTI DIRECTIONAL 2.7X18 (Screw) ×1 IMPLANT
SCREW NLOCK 2.7X14 (Screw) ×2 IMPLANT
SOAP 2 % CHG 4 OZ (WOUND CARE) ×2 IMPLANT
SPLINT FIBERGLASS 3X35 (CAST SUPPLIES) ×1 IMPLANT
SPONGE GAUZE 4X4 12PLY (GAUZE/BANDAGES/DRESSINGS) ×2 IMPLANT
SUT PROLENE 4 0 PS 2 18 (SUTURE) ×4 IMPLANT
SUT VIC AB 2-0 FS1 27 (SUTURE) ×2 IMPLANT
SUT VICRYL 4-0 PS2 18IN ABS (SUTURE) ×2 IMPLANT
SYR CONTROL 10ML LL (SYRINGE) IMPLANT
SYSTEM CHEST DRAIN TLS 7FR (DRAIN) IMPLANT
TOWEL OR 17X24 6PK STRL BLUE (TOWEL DISPOSABLE) ×2 IMPLANT
TOWEL OR 17X26 10 PK STRL BLUE (TOWEL DISPOSABLE) ×2 IMPLANT
TUBE CONNECTING 12X1/4 (SUCTIONS) ×2 IMPLANT
WATER STERILE IRR 1000ML POUR (IV SOLUTION) ×2 IMPLANT

## 2013-02-26 NOTE — Anesthesia Postprocedure Evaluation (Signed)
  Anesthesia Post-op Note  Patient: [name]  Procedure(s) Performed: Procedure(s): OPEN REDUCTION INTERNAL FIXATION (ORIF) WRIST FRACTURE (Left)  Patient Location: PACU  Anesthesia Type:GA combined with regional for post-op pain  Level of Consciousness: awake, alert  and oriented  Airway and Oxygen Therapy: Patient Spontanous Breathing and Patient connected to nasal cannula oxygen  Post-op Pain: mild  Post-op Assessment: Post-op Vital signs reviewed  Post-op Vital Signs: Reviewed  Complications: No apparent anesthesia complications

## 2013-02-26 NOTE — Anesthesia Preprocedure Evaluation (Addendum)
Anesthesia Evaluation  Patient identified by MRN, date of birth, ID band Patient awake    Reviewed: Allergy & Precautions, H&P , NPO status , Patient's Chart, lab work & pertinent test results  History of Anesthesia Complications (+) PONV  Airway Mallampati: II  Neck ROM: full    Dental  (+) Edentulous Upper and Partial Lower   Pulmonary asthma ,          Cardiovascular hypertension, Pt. on medications +CHF     Neuro/Psych Anxiety Depression CVA (left sided weakness), Residual Symptoms    GI/Hepatic GERD-  Medicated and Controlled,  Endo/Other  diabetes, Type 2, Insulin DependentHypothyroidism obese  Renal/GU      Musculoskeletal   Abdominal   Peds  Hematology   Anesthesia Other Findings   Reproductive/Obstetrics                          Anesthesia Physical Anesthesia Plan  ASA: III  Anesthesia Plan: General and Regional   Post-op Pain Management: MAC Combined w/ Regional for Post-op pain   Induction: Intravenous  Airway Management Planned: LMA  Additional Equipment:   Intra-op Plan:   Post-operative Plan:   Informed Consent: I have reviewed the patients History and Physical, chart, labs and discussed the procedure including the risks, benefits and alternatives for the proposed anesthesia with the patient or authorized representative who has indicated his/her understanding and acceptance.     Plan Discussed with: CRNA, Anesthesiologist and Surgeon  Anesthesia Plan Comments:         Anesthesia Quick Evaluation

## 2013-02-26 NOTE — Op Note (Signed)
PREOPERATIVE DIAGNOSIS: Left wrist intra-articular distal radius  fracture, 3 or more fragments.   POSTOPERATIVE DIAGNOSIS: Left wrist intra-articular distal radius  fracture, 3 or more fragments.   ATTENDING PHYSICIAN: Sharma Covert IV, MD who scrubbed and present  entire procedure.   ASSISTANT SURGEON: None.   ANESTHESIA: Supraclavicular block performed by Dr. Cipriano Mile and general anesthesia .  SURGICAL IMPLANTS: [employer] DVR cross lock with seven distal locking pegs and 4 bicortical screws proximally  SURGICAL PROCEDURE:  1. Open treatment of left wrist intra-articular distal radius fracture, 3 or more fragments.  2. Left wrist brachioradialis tenotomy and release.  3. Radiographs, stress radiographs, left wrist.   SURGICAL INDICATIONS: Mrs. Armwood  is a right-hand-dominant female who sustained an intra-articular distal radius fracture after a fall. The  patient was seen and evaluated in the ED based on degree of  displacement and the  displacement, recommended that she undergo  the above procedure. Risks, benefits, and alternatives were discussed  in detail with the patient. Signed informed consent was obtained.  Risks include, but not limited to bleeding, infection, damage to nearby  nerves, arteries, or tendons, nonunion, malunion, hardware failure, loss  of motion of the elbow, wrist, and digits, and need for further surgical  intervention.   PROCEDURE: The patient was properly identified in the preop holding  area. A mark with a permanent marker was made on the left wrist to  indicate correct operative site. The patient tolerated the block  performed by Anesthesia. The patient was then brought back to the  operating room. The patient received preoperative antibiotics. General  anesthesia was induced. Left upper extremity was prepped and draped in  normal sterile fashion. Time-out was called. Correct site was  identified, and procedure then begun. Attention was  then turned to the  left wrist. The limb was then elevated using Esmarch exsanguination and  tourniquet insufflated. A longitudinal incision was made directly over  the FCR sheath. Dissection was then carried down through the skin and  subcutaneous tissue. The FCR sheath was then opened proximally and  distally. Careful dissection was done going through the floor of the  FCR sheath where the FPL was identified. An L-shaped pronator quadratus  flap was then elevated. In order to aid in reduction tenotomy and release of the brachioradialis was completed with protection of the first dorsal compartment tendons.  The fracture site was then opened and the patient did have several fracture fragment extendin into the joint greater than 3 part intra-articular fracture. Careful open reduction was then carried out. The appropriate size dvr plate was chosen.. The oblong screw hole was then drilled with a 2.2 mm drill bit, then 2.7 mm bicortical screw. Plate height was adjusted.   After position was then confirmed using mini C-arm, the distal row fixation was then carried out with the beginning from an ulnar to radial direction with the  locking pegs. The total of 7 locking pegs were then placed. Following this, attention was then turned proximally where 2 more nonlocking screws were then placed and 2 locking screws.  The wound was then thoroughly irrigated. Final stress radiography was then carried out.  Stress radiographs were then obtained under live fluoro showing no widening of the SL interval. I did not see any carpal dissociation with good fixation, without any evidence of penetration in the articular margin with the locking pegs.    Postop, the pronator quadratus was then closed with 2-0 Vicryl.  Tourniquet was then deflated. Hemostasis  was then obtained. The  subcutaneous tissues closed with 4-0 Vicryl and skin closed with simple  nylon sutures. Adaptic dressing and sterile compressive bandage was   then applied. The patient was then placed in a well-padded volar splint. Extubated and taken to recovery room in good condition.    INTRAOPERATIVE RADIOGRAPHS:, 3 views of the wrist do show  the volar plate fixation in place. There is good position in both  planes.    POSTOPERATIVE PLAN: The patient will be admitted for IV antibiotics and  pain control; discharged in the morning. Seen back in the office for  approximately 10-14 days for wound check, suture removal, and then x-  rays, short-arm cast for total 4 weeks, and then begin a therapy regimen  around a 4-week mark. Radiographs at each visit.    Madelynn Done, MD

## 2013-02-26 NOTE — H&P (Signed)
[name] is an 67 y.o. female.   Chief Complaint: LEFT WRIST INJURY HPI: PT FELL ON 02/16/2013 PT SUSTAINED CLOSED DISTAL RADIUS FRACTURE PT HERE FOR SURGERY FOR LEFT WRIST ORIF AFTER FALL PT SEEN AND EXAMINED IN OFFICE  Past Medical History  Diagnosis Date  . Hyperlipidemia   . Thyroid disease   . Hypertension   . Ear infection   . Sinus problem   . CHF (congestive heart failure)   . Diverticula, intestine   . Complication of anesthesia     had bronch spasms post bladder surgery-had neb tx-that was the only time this happened  . Wears glasses   . Wears dentures     top-partial lower  . PONV (postoperative nausea and vomiting)   . Stroke 2013  . Hypothyroidism   . Depression   . Anxiety   . GERD (gastroesophageal reflux disease)   . Diabetes mellitus     fasting blood sugar 130s  . Sickle cell anemia     trait    Past Surgical History  Procedure Laterality Date  . Lipoma resection  July 2012    8cm, 4cm Left neck, right upper back  . Abdominal hysterectomy    . Bladder suspension    . Bilateral oophorectomy    . Thyroidectomy, partial  1985  . Breast lumpectomy with needle localization Left 11/29/2012    Procedure: BREAST LUMPECTOMY WITH NEEDLE LOCALIZATION;  Surgeon: Shelly Rubenstein, MD;  Location: [facility];  Service: General;  Laterality: Left;    Family History  Problem Relation Age of Onset  . Heart disease Mother   . Hypertension Mother   . Diabetes Mother   . Heart disease Father   . Hypertension Father   . Diabetes Father   . Heart disease Sister   . Hypertension Sister   . Diabetes Sister   . Hypertension Brother   . Cancer Brother   . Diabetes Sister   . Hypertension Sister   . Diabetes Sister   . Hypertension Sister    Social History:  reports that she quit smoking about 7 years ago. She has never used smokeless tobacco. She reports that  drinks alcohol. She reports that she does not use illicit drugs.  Allergies:   Allergies  Allergen Reactions  . Sulfonamide Derivatives Shortness Of Breath  . Codeine Itching    Medications Prior to Admission  Medication Sig Dispense Refill  . amLODipine (NORVASC) 10 MG tablet Take 1 tablet (10 mg total) by mouth daily.  30 tablet  3  . clopidogrel (PLAVIX) 75 MG tablet Take 1 tablet (75 mg total) by mouth daily.  30 tablet  3  . glimepiride (AMARYL) 4 MG tablet Take 1 tablet (4 mg total) by mouth daily before breakfast.  30 tablet  3  . hydrochlorothiazide (HYDRODIURIL) 25 MG tablet Take 1 tablet (25 mg total) by mouth daily.  30 tablet  3  . insulin glargine (LANTUS) 100 UNIT/ML injection Inject 50 Units into the skin daily.  10 mL  3  . irbesartan (AVAPRO) 150 MG tablet Take 150 mg by mouth daily.      Marland Kitchen oxyCODONE-acetaminophen (PERCOCET/ROXICET) 5-325 MG per tablet Take 1 tablet by mouth every 6 (six) hours as needed for pain.  15 tablet  0  . pantoprazole (PROTONIX) 40 MG tablet Take 40 mg by mouth daily.      . pravastatin (PRAVACHOL) 10 MG tablet Take 20 mg by mouth at bedtime.       Marland Kitchen  SYNTHROID 112 MCG tablet Take 1 tablet (112 mcg total) by mouth daily.  30 tablet  3    Results for orders placed during the hospital encounter of 02/26/13 (from the past 48 hour(s))  POCT I-STAT 4, (NA,K, GLUC, HGB,HCT)     Status: Abnormal   Collection Time    02/26/13  2:11 PM      Result Value Range   Sodium 140  135 - 145 mEq/L   Potassium 3.2 (*) 3.5 - 5.1 mEq/L   Glucose, Bld 98  70 - 99 mg/dL   HCT 16.1  09.6 - 04.5 %   Hemoglobin 13.6  12.0 - 15.0 g/dL  GLUCOSE, CAPILLARY     Status: None   Collection Time    02/26/13  4:03 PM      Result Value Range   Glucose-Capillary 82  70 - 99 mg/dL  GLUCOSE, CAPILLARY     Status: Abnormal   Collection Time    02/26/13  5:31 PM      Result Value Range   Glucose-Capillary 101 (*) 70 - 99 mg/dL   No results found.  NO RECENT ILLNESSES OR HOSPITALIZATIONS  Blood pressure 134/74, pulse 75, temperature 97.7 F (36.5  C), temperature source Tympanic, resp. rate 18, height 5\' 4"  (1.626 m), weight 97.523 kg (215 lb), SpO2 96.00%. General Appearance:  Alert, cooperative, no distress, appears stated age  Head:  Normocephalic, without obvious abnormality, atraumatic  Eyes:  Pupils equal, conjunctiva/corneas clear,         Throat: Lips, mucosa, and tongue normal; teeth and gums normal  Neck: No visible masses     Lungs:   respirations unlabored  Chest Wall:  No tenderness or deformity  Heart:  Regular rate and rhythm,  Abdomen:   Soft, non-tender,         Extremities: LEFT WRIST: FINGERS WARM WELL PERFUSED GOOD THUMB PERFUSION BLOCK IN PLACE  SPLINT IN PLACE  Pulses: 2+ and symmetric  Skin: Skin color, texture, turgor normal, no rashes or lesions     Neurologic: Normal    Assessment/Plan LEFT WRIST INTRAARTICULAR DISTAL RADIUS FRACTURE  LEFT WRIST OPEN REDUCTION AND INTERNAL FIXATION  R/B/A DISCUSSED WITH PT IN OFFICE.  PT VOICED UNDERSTANDING OF PLAN CONSENT SIGNED DAY OF SURGERY PT SEEN AND EXAMINED PRIOR TO OPERATIVE PROCEDURE/DAY OF SURGERY SITE MARKED. QUESTIONS ANSWERED WILL REMAIN AN INPATIENT FOLLOWING SURGERY  Sharma Covert 02/26/2013, 5:57 PM

## 2013-02-26 NOTE — Anesthesia Procedure Notes (Addendum)
Anesthesia Regional Block:  Supraclavicular block  Pre-Anesthetic Checklist: ,, timeout performed, Correct Patient, Correct Site, Correct Laterality, Correct Procedure, Correct Position, site marked, Risks and benefits discussed,  Surgical consent,  Pre-op evaluation,  At surgeon's request and post-op pain management  Laterality: Left  Prep: chloraprep       Needles:  Injection technique: Single-shot  Needle Type: Echogenic Stimulator Needle     Needle Length: 5cm 5 cm Needle Gauge: 22 and 22 G    Additional Needles:  Procedures: ultrasound guided (picture in chart) and nerve stimulator Supraclavicular block  Nerve Stimulator or Paresthesia:  Response: biceps flexion, 0.45 mA,   Additional Responses:   Narrative:  Start time: 02/26/2013 4:45 PM End time: 02/26/2013 4:54 PM Injection made incrementally with aspirations every 5 mL.  Performed by: Personally  Anesthesiologist: Dr Chaney Malling  Additional Notes: Functioning IV was confirmed and monitors were applied.  A 50mm 22ga Arrow echogenic stimulator needle was used. Sterile prep and drape,hand hygiene and sterile gloves were used.  Negative aspiration and negative test dose prior to incremental administration of local anesthetic. The patient tolerated the procedure well.  Ultrasound guidance: relevent anatomy identified, needle position confirmed, local anesthetic spread visualized around nerve(s), vascular puncture avoided.  Image printed for medical record.   Supraclavicular block Procedure Name: LMA Insertion Date/Time: 02/26/2013 6:13 PM Performed by: Arlice Colt B Pre-anesthesia Checklist: Patient identified, Timeout performed, Emergency Drugs available, Suction available and Patient being monitored Patient Re-evaluated:Patient Re-evaluated prior to inductionOxygen Delivery Method: Circle system utilized Preoxygenation: Pre-oxygenation with 100% oxygen Intubation Type: IV induction LMA: LMA inserted LMA Size:  4.0 Number of attempts: 1 Placement Confirmation: positive ETCO2 and breath sounds checked- equal and bilateral Tube secured with: Tape Dental Injury: Teeth and Oropharynx as per pre-operative assessment

## 2013-02-26 NOTE — Transfer of Care (Signed)
Immediate Anesthesia Transfer of Care Note  Patient: [name]  Procedure(s) Performed: Procedure(s): OPEN REDUCTION INTERNAL FIXATION (ORIF) WRIST FRACTURE (Left)  Patient Location: PACU  Anesthesia Type:General  Level of Consciousness: awake, alert  and oriented  Airway & Oxygen Therapy: Patient Spontanous Breathing and Patient connected to nasal cannula oxygen  Post-op Assessment: Report given to PACU RN and Post -op Vital signs reviewed and stable  Post vital signs: Reviewed and stable  Complications: No apparent anesthesia complications

## 2013-02-26 NOTE — Brief Op Note (Signed)
02/26/2013  5:59 PM  PATIENT:  [name]  67 y.o. female  PRE-OPERATIVE DIAGNOSIS:  LEFT WRIST DISTAL RADIUS FRACTURE  POST-OPERATIVE DIAGNOSIS:  same  PROCEDURE:  Procedure(s): OPEN REDUCTION INTERNAL FIXATION (ORIF) WRIST FRACTURE (Left)  SURGEON:  Surgeon(s) and Role:    * Sharma Covert, MD - Primary  PHYSICIAN ASSISTANT: none  ASSISTANTS: none   ANESTHESIA:   general  EBL:   none  BLOOD ADMINISTERED:none  DRAINS: none   LOCAL MEDICATIONS USED:  MARCAINE     SPECIMEN:  No Specimen  DISPOSITION OF SPECIMEN:  N/A  COUNTS:  YES  TOURNIQUET:  * No tourniquets in log *  DICTATION: .typed  PLAN OF CARE: Admit for overnight observation  PATIENT DISPOSITION:  PACU - hemodynamically stable.   Delay start of Pharmacological VTE agent (>24hrs) due to surgical blood loss or risk of bleeding: not applicable

## 2013-02-27 ENCOUNTER — Encounter (HOSPITAL_COMMUNITY): Payer: Self-pay | Admitting: General Practice

## 2013-02-27 LAB — GLUCOSE, CAPILLARY
Glucose-Capillary: 202 mg/dL — ABNORMAL HIGH (ref 70–99)
Glucose-Capillary: 262 mg/dL — ABNORMAL HIGH (ref 70–99)
Glucose-Capillary: 166 mg/dL — ABNORMAL HIGH (ref 70–99)
Glucose-Capillary: 122 mg/dL — ABNORMAL HIGH (ref 70–99)

## 2013-02-27 NOTE — Evaluation (Signed)
Occupational Therapy Evaluation Patient Details Name: [name] MRN: [id number] DOB: February 07, 1946 Today's Date: 02/27/2013 Time: 4098-1191 OT Time Calculation (min): 49 min  OT Assessment / Plan / Recommendation History of present illness Pt is a 67 yr old female who sustained a fall and subsequently a left wrist fracture.  Pt underwent an ORIF of the radius via Dr. Melvyn Novas.   Clinical Impression   Pt is pleasant and overall modified independent for selfcare tasks.  Still with limited AROM digit extension to approximately 50% of normal and flexion to 80% within the confines of the splint.  Pt does not have any assistance at home and the biggest concern is that she is able to draw up her daily insulin.  May need home health nursing to assist with this as pt does not have any family or assistance per her report.  Have discussed with nursing as well.  Will continue to follow while inpatient but do not see any post acute OT needs until she is ready for ROM at the wrist.    OT Assessment  Patient needs continued OT Services    Follow Up Recommendations  No OT follow up       Equipment Recommendations  None recommended by OT       Frequency  Min 2X/week    Precautions / Restrictions Restrictions Weight Bearing Restrictions: Yes LUE Weight Bearing: Non weight bearing   Pertinent Vitals/Pain Pain 10/10 but meds given at beginning of session, ice placed on volar wrist and arm elevated    ADL  Eating/Feeding: Simulated;Modified independent Where Assessed - Eating/Feeding: Chair Grooming: Performed;Modified independent Where Assessed - Grooming: Unsupported standing Upper Body Bathing: Performed;Modified independent Where Assessed - Upper Body Bathing: Unsupported sitting Lower Body Bathing: Performed;Modified independent Where Assessed - Lower Body Bathing: Supported sit to stand Upper Body Dressing: Performed;Modified independent Where Assessed - Upper Body Dressing: Unsupported  sitting Lower Body Dressing: Performed;Modified independent Where Assessed - Lower Body Dressing: Unsupported sit to stand Toilet Transfer: Performed;Modified independent Toilet Transfer Method: Other (comment) (ambulate without assistive device) Toilet Transfer Equipment: Comfort height toilet;Grab bars Toileting - Clothing Manipulation and Hygiene: Performed;Modified independent Where Assessed - Toileting Clothing Manipulation and Hygiene: Sit to stand from 3-in-1 or toilet Tub/Shower Transfer: Simulated;Modified independent Tub/Shower Transfer Method: Ambulating Transfers/Ambulation Related to ADLs: Pt is independent with mobility without use of assistive device. ADL Comments: Pt issued long handle sponge to assist with washing of the RUE, underarm, and back.  Pt is able to demonstrate one handed techniques for dressing as well.  Educated pt on positioning of the LUE to help decrease pain and edema.  Biggest concern is for pt to be able to draw up her insulin and hold the bottle with the LUE.      OT Diagnosis: Acute pain;Generalized weakness  OT Problem List: Decreased strength;Decreased range of motion;Pain OT Treatment Interventions: Therapeutic exercise;Self-care/ADL training;Patient/family education;DME and/or AE instruction   OT Goals(Current goals can be found in the care plan section) Acute Rehab OT Goals Patient Stated Goal: To be able to get the cast off as soon as possible and be able to do for herself. OT Goal Formulation: With patient Time For Goal Achievement: 03/06/13 Potential to Achieve Goals: Good  Visit Information  Last OT Received On: 02/27/13 Assistance Needed: +1 History of Present Illness: Pt is a 67 yr old female who sustained a fall and subsequently a left wrist fracture.  Pt underwent an ORIF of the radius via Dr. Melvyn Novas.  Prior Functioning     Home Living Family/patient expects to be discharged to:: Private residence Living Arrangements:  Alone Type of Home: Apartment Home Layout: One level Home Equipment: Grab bars - toilet;Grab bars - tub/shower;Cane - single point;Shower seat Prior Function Level of Independence: Independent Communication Communication: No difficulties Dominant Hand: Right         Vision/Perception Vision - History Baseline Vision: Wears glasses all the time Patient Visual Report: No change from baseline Vision - Assessment Eye Alignment: Within Functional Limits Vision Assessment: Vision not tested Perception Perception: Within Functional Limits Praxis Praxis: Intact   Cognition  Cognition Arousal/Alertness: Awake/alert Behavior During Therapy: WFL for tasks assessed/performed Overall Cognitive Status: Within Functional Limits for tasks assessed    Extremity/Trunk Assessment Upper Extremity Assessment Upper Extremity Assessment: LUE deficits/detail LUE Deficits / Details: Pt with cast from her elbow down to teh MPs.  Exhibits approxamately 50% digit extension AROM and 75 % of finger flexion within the confines of the cast.  Noted slight thumb flexion and extension as well. LUE: Unable to fully assess due to immobilization LUE Sensation: decreased light touch LUE Coordination: decreased fine motor Lower Extremity Assessment Lower Extremity Assessment: Overall WFL for tasks assessed Cervical / Trunk Assessment Cervical / Trunk Assessment: Normal     Mobility Bed Mobility Bed Mobility: Supine to Sit Supine to Sit: 7: Independent Transfers Transfers: Sit to Stand;Stand to Sit Sit to Stand: 7: Independent;With upper extremity assist Stand to Sit: 7: Independent;Without upper extremity assist     Exercise General Exercises - Upper Extremity Digit Composite Flexion: AROM;Left;20 reps;Seated Composite Extension: AAROM;Left;Seated   Balance Balance Balance Assessed: Yes Dynamic Standing Balance Dynamic Standing - Balance Support: No upper extremity supported Dynamic Standing -  Level of Assistance: 7: Independent   End of Session OT - End of Session Activity Tolerance: Patient tolerated treatment well Patient left: in chair;with call bell/phone within reach Nurse Communication: Mobility status;Other (comment) (Pt's concerns about insulin at home.)  GO Functional Assessment Tool Used: clinical judgement Functional Limitation: Carrying, moving and handling objects Carrying, Moving and Handling Objects Current Status ([id number]): At least 80 percent but less than 100 percent impaired, limited or restricted Carrying, Moving and Handling Objects Goal Status [phone]): At least 80 percent but less than 100 percent impaired, limited or restricted   Araina Butrick OTR/L Pager number [phone] 02/27/2013, 10:07 AM

## 2013-02-27 NOTE — Discharge Summary (Signed)
Physician Discharge Summary  Patient ID: [name] MRN: [id number] DOB/AGE: 65-11-1945 65 y.o.  Admit date: 02/26/2013 Discharge date: 02/27/2013  Admission Diagnoses: LEFT WRIST DISTAL RADIUS FRACTURE Past Medical History  Diagnosis Date  . Hyperlipidemia   . Thyroid disease   . Hypertension   . Ear infection   . Sinus problem   . CHF (congestive heart failure)   . Diverticula, intestine   . Complication of anesthesia     had bronch spasms post bladder surgery-had neb tx-that was the only time this happened  . Wears glasses   . Wears dentures     top-partial lower  . PONV (postoperative nausea and vomiting)   . Stroke 2013  . Hypothyroidism   . Depression   . Anxiety   . GERD (gastroesophageal reflux disease)   . Diabetes mellitus     fasting blood sugar 130s  . Sickle cell anemia     trait    Discharge Diagnoses:  Left distal radius fracture  Surgeries: Procedure(s): OPEN REDUCTION INTERNAL FIXATION (ORIF) WRIST FRACTURE on 02/26/2013    Consultants:  none  Discharged Condition: Improved  Hospital Course: [name] is an 67 y.o. female who was admitted 02/26/2013 with a chief complaint of No chief complaint on file. , and found to have a diagnosis of LEFT WRIST DISTAL RADIUS FRACTURE.  They were brought to the operating room on 02/26/2013 and underwent Procedure(s): OPEN REDUCTION INTERNAL FIXATION (ORIF) WRIST FRACTURE.    They were given perioperative antibiotics: Anti-infectives   Start     Dose/Rate Route Frequency Ordered Stop   02/27/13 0200  ceFAZolin (ANCEF) IVPB 1 g/50 mL premix     1 g 100 mL/hr over 30 Minutes Intravenous Every 8 hours 02/26/13 2211     02/26/13 2215  ceFAZolin (ANCEF) IVPB 1 g/50 mL premix     1 g 100 mL/hr over 30 Minutes Intravenous NOW 02/26/13 2211 02/26/13 2308   02/26/13 1500  ceFAZolin (ANCEF) IVPB 2 g/50 mL premix     2 g 100 mL/hr over 30 Minutes Intravenous On call to O.R. 02/26/13 1358 02/26/13 1800   02/26/13  1402  ceFAZolin (ANCEF) 2-3 GM-% IVPB SOLR    Comments:  GALLMAN, KATHIE JEAN: cabinet override      02/26/13 1402 02/27/13 0214    .  They were given sequential compression devices, early ambulation, and Other (comment) ambulation for DVT prophylaxis.  Recent vital signs: Patient Vitals for the past 24 hrs:  BP Temp Pulse Resp SpO2  02/27/13 0948 - - 85 - 96 %  02/27/13 0600 135/76 mmHg 98.1 F (36.7 C) 88 16 97 %  02/27/13 0239 142/75 mmHg 97.6 F (36.4 C) 88 16 97 %  02/26/13 2207 166/82 mmHg 98.2 F (36.8 C) 96 18 97 %  02/26/13 2140 159/75 mmHg 98.1 F (36.7 C) 91 24 95 %  02/26/13 2135 153/81 mmHg - 80 22 97 %  02/26/13 2130 - - 90 11 98 %  02/26/13 2110 151/82 mmHg - 89 20 90 %  02/26/13 2055 123/94 mmHg - 91 26 93 %  02/26/13 2040 149/72 mmHg - 93 19 93 %  02/26/13 2030 - - 93 21 92 %  02/26/13 2025 149/72 mmHg - 90 25 92 %  02/26/13 2010 140/77 mmHg 97.9 F (36.6 C) 97 24 90 %  .  Recent laboratory studies: No results found.  Discharge Medications:     Medication List    TAKE these medications  amLODipine 10 MG tablet  Commonly known as:  NORVASC  Take 1 tablet (10 mg total) by mouth daily.     clopidogrel 75 MG tablet  Commonly known as:  PLAVIX  Take 1 tablet (75 mg total) by mouth daily.     docusate sodium 100 MG capsule  Commonly known as:  COLACE  Take 1 capsule (100 mg total) by mouth 2 (two) times daily.     glimepiride 4 MG tablet  Commonly known as:  AMARYL  Take 1 tablet (4 mg total) by mouth daily before breakfast.     hydrochlorothiazide 25 MG tablet  Commonly known as:  HYDRODIURIL  Take 1 tablet (25 mg total) by mouth daily.     insulin glargine 100 UNIT/ML injection  Commonly known as:  LANTUS  Inject 50 Units into the skin daily.     irbesartan 150 MG tablet  Commonly known as:  AVAPRO  Take 150 mg by mouth daily.     methocarbamol 500 MG tablet  Commonly known as:  ROBAXIN  Take 1 tablet (500 mg total) by mouth 4  (four) times daily.     oxyCODONE-acetaminophen 5-325 MG per tablet  Commonly known as:  PERCOCET/ROXICET  Take 1 tablet by mouth every 6 (six) hours as needed for pain.     oxyCODONE-acetaminophen 10-325 MG per tablet  Commonly known as:  PERCOCET  Take 1 tablet by mouth every 4 (four) hours as needed for pain.     pantoprazole 40 MG tablet  Commonly known as:  PROTONIX  Take 40 mg by mouth daily.     pravastatin 10 MG tablet  Commonly known as:  PRAVACHOL  Take 20 mg by mouth at bedtime.     SYNTHROID 112 MCG tablet  Generic drug:  levothyroxine  Take 1 tablet (112 mcg total) by mouth daily.     vitamin C 500 MG tablet  Commonly known as:  ASCORBIC ACID  Take 1 tablet (500 mg total) by mouth daily.        Diagnostic Studies: Dg Chest 2 View  02/24/2013   *RADIOLOGY REPORT*  Clinical Data: Left wrist ORIF, preoperative radiograph.  CHEST - 2 VIEW  Comparison: 10/29/2009 chest radiograph.  Findings: Left basilar atelectasis and / or scarring.  No airspace disease.  No pleural effusion.  The cardiopericardial silhouette is within normal limits.  IMPRESSION: Mild left basilar atelectasis or scarring.  No acute cardiopulmonary disease.   Original Report Authenticated By: Andreas Newport, M.D.   Dg Forearm Left  02/16/2013   *RADIOLOGY REPORT*  Clinical Data: Pain post fall  LEFT FOREARM - 2 VIEW  Comparison: None.  Findings: Transverse comminuted fracture of the distal radius with extension to the distal radial articular surface with 2 mm distraction  of the subchondral cortex fragments.  There is neutral angulation of the distal radial articular surface.  There is a minimally-displaced ulnar styloid fracture.  Carpal rows appear intact.  IMPRESSION:    1.  Comminuted intra-articular distal radial fracture with neutral angulation of the distal radial articular surface.  2.  Ulnar styloid avulsion fracture.   Original Report Authenticated By: D. Andria Rhein, MD   Dg Wrist Complete  Left  02/16/2013   *RADIOLOGY REPORT*  Clinical Data: Pain post fall  LEFT WRIST - COMPLETE 3+ VIEW  Comparison: None.  Findings: Transverse comminuted fracture of the distal radius with extension to the distal radial articular surface but no significant distraction or step-off deformity of the subchondral cortex.  There is neutral angulation of the distal radial articular surface. There is a minimally-displaced ulnar styloid fracture.  Carpal rows appear intact.  IMPRESSION:  1.  Comminuted intra-articular distal radial fracture with neutral angulation of the distal radial articular surface. 2.  Ulnar styloid avulsion fracture.   Original Report Authenticated By: D. Andria Rhein, MD    They benefited maximally from their hospital stay and there were no complications.     Disposition: 01-Home or Self Care      Follow-up Information   Follow up with Sharma Covert, MD. Schedule an appointment as soon as possible for a visit in 15 days.   Contact information:   [address] [place] [address] [phone]         Signed: Sharma Covert 02/27/2013, 5:24 PM

## 2013-02-27 NOTE — Progress Notes (Signed)
PT Cancellation Note  Patient Details Name: [name] MRN: [id number] DOB: 1945-10-16   Cancelled Treatment:    Reason Eval/Treat Not Completed: PT screened, no needs identified, will sign off (OT notified PT that there is no PT needs. )   [name] 02/27/2013, 1:14 PM Jake Shark, PT DPT [phone]

## 2013-02-27 NOTE — Progress Notes (Signed)
RN reviewed discharge instructions with patient. All questions answered. Patient informed about follow up appointment, patient then wheeled down to friends car with NT.

## 2013-02-28 ENCOUNTER — Encounter (HOSPITAL_COMMUNITY): Payer: Self-pay | Admitting: Orthopedic Surgery

## 2013-09-22 ENCOUNTER — Other Ambulatory Visit: Payer: Self-pay

## 2013-09-22 DIAGNOSIS — Z1231 Encounter for screening mammogram for malignant neoplasm of breast: Secondary | ICD-10-CM

## 2013-09-22 DIAGNOSIS — Z9889 Other specified postprocedural states: Secondary | ICD-10-CM

## 2013-10-27 ENCOUNTER — Emergency Department (HOSPITAL_COMMUNITY): Payer: Medicaid Other

## 2013-10-27 ENCOUNTER — Encounter (HOSPITAL_COMMUNITY): Payer: Self-pay | Admitting: Emergency Medicine

## 2013-10-27 ENCOUNTER — Ambulatory Visit: Payer: Medicare Other

## 2013-10-27 ENCOUNTER — Inpatient Hospital Stay (HOSPITAL_COMMUNITY)
Admission: EM | Admit: 2013-10-27 | Discharge: 2013-10-29 | DRG: 189 | Disposition: A | Discharge status: Home / Self Care | Payer: Medicaid Other | Attending: Internal Medicine | Admitting: Internal Medicine

## 2013-10-27 DIAGNOSIS — Z794 Long term (current) use of insulin: Secondary | ICD-10-CM

## 2013-10-27 DIAGNOSIS — F329 Major depressive disorder, single episode, unspecified: Secondary | ICD-10-CM | POA: Diagnosis present

## 2013-10-27 DIAGNOSIS — Z87891 Personal history of nicotine dependence: Secondary | ICD-10-CM

## 2013-10-27 DIAGNOSIS — Z8249 Family history of ischemic heart disease and other diseases of the circulatory system: Secondary | ICD-10-CM

## 2013-10-27 DIAGNOSIS — IMO0002 Reserved for concepts with insufficient information to code with codable children: Secondary | ICD-10-CM

## 2013-10-27 DIAGNOSIS — J9600 Acute respiratory failure, unspecified whether with hypoxia or hypercapnia: Principal | ICD-10-CM | POA: Diagnosis present

## 2013-10-27 DIAGNOSIS — K219 Gastro-esophageal reflux disease without esophagitis: Secondary | ICD-10-CM

## 2013-10-27 DIAGNOSIS — B373 Candidiasis of vulva and vagina: Secondary | ICD-10-CM | POA: Diagnosis present

## 2013-10-27 DIAGNOSIS — E119 Type 2 diabetes mellitus without complications: Secondary | ICD-10-CM

## 2013-10-27 DIAGNOSIS — E079 Disorder of thyroid, unspecified: Secondary | ICD-10-CM | POA: Diagnosis present

## 2013-10-27 DIAGNOSIS — J441 Chronic obstructive pulmonary disease with (acute) exacerbation: Secondary | ICD-10-CM | POA: Diagnosis present

## 2013-10-27 DIAGNOSIS — Z885 Allergy status to narcotic agent status: Secondary | ICD-10-CM

## 2013-10-27 DIAGNOSIS — F3289 Other specified depressive episodes: Secondary | ICD-10-CM | POA: Diagnosis present

## 2013-10-27 DIAGNOSIS — E1159 Type 2 diabetes mellitus with other circulatory complications: Secondary | ICD-10-CM

## 2013-10-27 DIAGNOSIS — F411 Generalized anxiety disorder: Secondary | ICD-10-CM | POA: Diagnosis present

## 2013-10-27 DIAGNOSIS — I10 Essential (primary) hypertension: Secondary | ICD-10-CM | POA: Diagnosis present

## 2013-10-27 DIAGNOSIS — Z7902 Long term (current) use of antithrombotics/antiplatelets: Secondary | ICD-10-CM

## 2013-10-27 DIAGNOSIS — Z8673 Personal history of transient ischemic attack (TIA), and cerebral infarction without residual deficits: Secondary | ICD-10-CM

## 2013-10-27 DIAGNOSIS — I639 Cerebral infarction, unspecified: Secondary | ICD-10-CM | POA: Diagnosis present

## 2013-10-27 DIAGNOSIS — R0603 Acute respiratory distress: Secondary | ICD-10-CM

## 2013-10-27 DIAGNOSIS — Z833 Family history of diabetes mellitus: Secondary | ICD-10-CM

## 2013-10-27 DIAGNOSIS — B3731 Acute candidiasis of vulva and vagina: Secondary | ICD-10-CM | POA: Diagnosis present

## 2013-10-27 DIAGNOSIS — D571 Sickle-cell disease without crisis: Secondary | ICD-10-CM | POA: Diagnosis present

## 2013-10-27 DIAGNOSIS — E039 Hypothyroidism, unspecified: Secondary | ICD-10-CM | POA: Diagnosis present

## 2013-10-27 DIAGNOSIS — Z79899 Other long term (current) drug therapy: Secondary | ICD-10-CM

## 2013-10-27 DIAGNOSIS — Z889 Allergy status to unspecified drugs, medicaments and biological substances status: Secondary | ICD-10-CM

## 2013-10-27 DIAGNOSIS — I509 Heart failure, unspecified: Secondary | ICD-10-CM | POA: Diagnosis present

## 2013-10-27 DIAGNOSIS — E785 Hyperlipidemia, unspecified: Secondary | ICD-10-CM

## 2013-10-27 DIAGNOSIS — I798 Other disorders of arteries, arterioles and capillaries in diseases classified elsewhere: Secondary | ICD-10-CM

## 2013-10-27 HISTORY — DX: Shortness of breath: R06.02

## 2013-10-27 HISTORY — DX: Chronic obstructive pulmonary disease, unspecified: J44.9

## 2013-10-27 LAB — GLUCOSE, CAPILLARY
GLUCOSE-CAPILLARY: 264 mg/dL — AB (ref 70–99)
GLUCOSE-CAPILLARY: 370 mg/dL — AB (ref 70–99)
Glucose-Capillary: 352 mg/dL — ABNORMAL HIGH (ref 70–99)

## 2013-10-27 LAB — CBC WITH DIFFERENTIAL/PLATELET
BASOS PCT: 0 % (ref 0–1)
Basophils Absolute: 0 10*3/uL (ref 0.0–0.1)
EOS ABS: 0 10*3/uL (ref 0.0–0.7)
Eosinophils Relative: 0 % (ref 0–5)
HEMATOCRIT: 40.7 % (ref 36.0–46.0)
HEMOGLOBIN: 13.3 g/dL (ref 12.0–15.0)
LYMPHS ABS: 5.1 10*3/uL — AB (ref 0.7–4.0)
Lymphocytes Relative: 57 % — ABNORMAL HIGH (ref 12–46)
MCH: 27.8 pg (ref 26.0–34.0)
MCHC: 32.7 g/dL (ref 30.0–36.0)
MCV: 85.1 fL (ref 78.0–100.0)
MONO ABS: 0.5 10*3/uL (ref 0.1–1.0)
MONOS PCT: 5 % (ref 3–12)
NEUTROS ABS: 3.4 10*3/uL (ref 1.7–7.7)
Neutrophils Relative %: 38 % — ABNORMAL LOW (ref 43–77)
Platelets: 175 10*3/uL (ref 150–400)
RBC: 4.78 MIL/uL (ref 3.87–5.11)
RDW: 13.5 % (ref 11.5–15.5)
WBC: 9 10*3/uL (ref 4.0–10.5)

## 2013-10-27 LAB — BASIC METABOLIC PANEL
BUN: 15 mg/dL (ref 6–23)
CHLORIDE: 100 meq/L (ref 96–112)
CO2: 26 meq/L (ref 19–32)
Calcium: 9.4 mg/dL (ref 8.4–10.5)
Creatinine, Ser: 0.73 mg/dL (ref 0.50–1.10)
GFR calc Af Amer: >90 mL/min (ref >90)
GFR calc non Af Amer: 88 mL/min — ABNORMAL LOW (ref >90)
GLUCOSE: 203 mg/dL — AB (ref 70–99)
POTASSIUM: 3.6 meq/L — AB (ref 3.7–5.3)
Sodium: 141 mEq/L (ref 137–147)

## 2013-10-27 LAB — TSH: TSH: 0.286 u[IU]/mL — ABNORMAL LOW (ref 0.350–4.500)

## 2013-10-27 LAB — PRO B NATRIURETIC PEPTIDE: Pro B Natriuretic peptide (BNP): 38.5 pg/mL (ref 0–125)

## 2013-10-27 LAB — HEMOGLOBIN A1C
HEMOGLOBIN A1C: 10 % — AB (ref <5.7)
MEAN PLASMA GLUCOSE: 240 mg/dL — AB (ref <117)

## 2013-10-27 LAB — TROPONIN I
Troponin I: <0.3 ng/mL (ref <0.30)
Troponin I: <0.3 ng/mL (ref <0.30)

## 2013-10-27 MED ORDER — INSULIN ASPART 100 UNIT/ML ~~LOC~~ SOLN
0.0000 [IU] | Freq: Every day | SUBCUTANEOUS | Status: DC
  Administered 2013-10-27: 5 [IU] via SUBCUTANEOUS

## 2013-10-27 MED ORDER — ALBUTEROL (5 MG/ML) CONTINUOUS INHALATION SOLN
10.0000 mg/h | INHALATION_SOLUTION | Freq: Once | RESPIRATORY_TRACT | Status: AC
  Administered 2013-10-27: 10 mg/h via RESPIRATORY_TRACT
  Filled 2013-10-27: qty 20

## 2013-10-27 MED ORDER — ONDANSETRON HCL 4 MG PO TABS: 4.0000 mg | ORAL_TABLET | Freq: Four times a day (QID) | ORAL | Status: DC | PRN

## 2013-10-27 MED ORDER — ACETAMINOPHEN 325 MG PO TABS
650.0000 mg | ORAL_TABLET | Freq: Four times a day (QID) | ORAL | Status: DC | PRN
  Administered 2013-10-27 – 2013-10-28 (×2): 650 mg via ORAL
  Filled 2013-10-27 (×2): qty 2

## 2013-10-27 MED ORDER — PREDNISONE 20 MG PO TABS
60.0000 mg | ORAL_TABLET | Freq: Once | ORAL | Status: AC
Start: 2013-10-27 — End: 2013-10-27
  Administered 2013-10-27: 60 mg via ORAL
  Filled 2013-10-27: qty 3

## 2013-10-27 MED ORDER — NITROGLYCERIN 2 % TD OINT
1.0000 [in_us] | TOPICAL_OINTMENT | Freq: Once | TRANSDERMAL | Status: DC
  Filled 2013-10-27: qty 1

## 2013-10-27 MED ORDER — PANTOPRAZOLE SODIUM 40 MG PO TBEC
40.0000 mg | DELAYED_RELEASE_TABLET | Freq: Every day | ORAL | Status: DC
  Administered 2013-10-27 – 2013-10-29 (×3): 40 mg via ORAL
  Filled 2013-10-27 (×3): qty 1

## 2013-10-27 MED ORDER — METHYLPREDNISOLONE SODIUM SUCC 125 MG IJ SOLR
60.0000 mg | Freq: Four times a day (QID) | INTRAMUSCULAR | Status: DC
  Administered 2013-10-27 – 2013-10-28 (×5): 60 mg via INTRAVENOUS
  Filled 2013-10-27 (×8): qty 0.96

## 2013-10-27 MED ORDER — FUROSEMIDE 10 MG/ML IJ SOLN
20.0000 mg | Freq: Once | INTRAMUSCULAR | Status: DC
  Filled 2013-10-27: qty 2

## 2013-10-27 MED ORDER — INSULIN ASPART 100 UNIT/ML ~~LOC~~ SOLN
0.0000 [IU] | Freq: Three times a day (TID) | SUBCUTANEOUS | Status: DC
Start: 2013-10-27 — End: 2013-10-28
  Administered 2013-10-27: 15 [IU] via SUBCUTANEOUS
  Administered 2013-10-27 – 2013-10-28 (×2): 8 [IU] via SUBCUTANEOUS

## 2013-10-27 MED ORDER — ENOXAPARIN SODIUM 40 MG/0.4ML ~~LOC~~ SOLN
40.0000 mg | SUBCUTANEOUS | Status: DC
  Administered 2013-10-27 – 2013-10-28 (×2): 40 mg via SUBCUTANEOUS
  Filled 2013-10-27 (×3): qty 0.4

## 2013-10-27 MED ORDER — CLOPIDOGREL BISULFATE 75 MG PO TABS
75.0000 mg | ORAL_TABLET | Freq: Every day | ORAL | Status: DC
  Administered 2013-10-27 – 2013-10-29 (×3): 75 mg via ORAL
  Filled 2013-10-27 (×4): qty 1

## 2013-10-27 MED ORDER — INSULIN GLARGINE 100 UNIT/ML ~~LOC~~ SOLN
50.0000 [IU] | Freq: Every day | SUBCUTANEOUS | Status: DC
  Administered 2013-10-27 – 2013-10-29 (×3): 50 [IU] via SUBCUTANEOUS
  Filled 2013-10-27 (×4): qty 0.5

## 2013-10-27 MED ORDER — ACETAMINOPHEN 650 MG RE SUPP: 650.0000 mg | Freq: Four times a day (QID) | RECTAL | Status: DC | PRN

## 2013-10-27 MED ORDER — IRBESARTAN 150 MG PO TABS
150.0000 mg | ORAL_TABLET | Freq: Every day | ORAL | Status: DC
  Administered 2013-10-27 – 2013-10-28 (×2): 150 mg via ORAL
  Filled 2013-10-27 (×3): qty 1

## 2013-10-27 MED ORDER — ONDANSETRON HCL 4 MG/2ML IJ SOLN: 4.0000 mg | Freq: Four times a day (QID) | INTRAMUSCULAR | Status: DC | PRN

## 2013-10-27 MED ORDER — LEVOTHYROXINE SODIUM 112 MCG PO TABS
112.0000 ug | ORAL_TABLET | Freq: Every day | ORAL | Status: DC
  Administered 2013-10-28 – 2013-10-29 (×2): 112 ug via ORAL
  Filled 2013-10-27 (×3): qty 1

## 2013-10-27 MED ORDER — IPRATROPIUM-ALBUTEROL 0.5-2.5 (3) MG/3ML IN SOLN
3.0000 mL | RESPIRATORY_TRACT | Status: DC
  Administered 2013-10-27 – 2013-10-28 (×5): 3 mL via RESPIRATORY_TRACT
  Filled 2013-10-27 (×4): qty 3

## 2013-10-27 MED ORDER — SODIUM CHLORIDE 0.9 % IJ SOLN
3.0000 mL | Freq: Two times a day (BID) | INTRAMUSCULAR | Status: DC
  Administered 2013-10-27 – 2013-10-29 (×5): 3 mL via INTRAVENOUS

## 2013-10-27 MED ORDER — SIMVASTATIN 10 MG PO TABS
10.0000 mg | ORAL_TABLET | Freq: Every day | ORAL | Status: DC
  Administered 2013-10-27 – 2013-10-28 (×2): 10 mg via ORAL
  Filled 2013-10-27 (×3): qty 1

## 2013-10-27 MED ORDER — ALBUTEROL SULFATE (2.5 MG/3ML) 0.083% IN NEBU
2.5000 mg | INHALATION_SOLUTION | RESPIRATORY_TRACT | Status: DC | PRN
  Administered 2013-10-29: 2.5 mg via RESPIRATORY_TRACT
  Filled 2013-10-27 (×2): qty 3

## 2013-10-27 MED ORDER — AZITHROMYCIN 250 MG PO TABS
250.0000 mg | ORAL_TABLET | Freq: Every day | ORAL | Status: DC
  Administered 2013-10-27 – 2013-10-29 (×3): 250 mg via ORAL
  Filled 2013-10-27 (×4): qty 1

## 2013-10-27 MED ORDER — MONTELUKAST SODIUM 10 MG PO TABS
10.0000 mg | ORAL_TABLET | Freq: Every day | ORAL | Status: DC
  Administered 2013-10-27 – 2013-10-28 (×2): 10 mg via ORAL
  Filled 2013-10-27 (×3): qty 1

## 2013-10-27 MED ORDER — ALBUTEROL SULFATE (2.5 MG/3ML) 0.083% IN NEBU
5.0000 mg | INHALATION_SOLUTION | Freq: Once | RESPIRATORY_TRACT | Status: AC
  Administered 2013-10-27: 5 mg via RESPIRATORY_TRACT
  Filled 2013-10-27: qty 6

## 2013-10-27 NOTE — ED Provider Notes (Signed)
Medical screening examination/treatment/procedure(s) were conducted as a shared visit with non-physician practitioner(s) and myself.  I personally evaluated the patient during the encounter.     Jabes Primo, MD 10/27/13 0721 

## 2013-10-27 NOTE — ED Notes (Signed)
Pt on cont neb reports she feels it is helping her breathing. Updated that after treatment will go upstairs to her room.

## 2013-10-27 NOTE — ED Provider Notes (Signed)
Care assumed from Earley FavorGail Schulz, NP.  [name] is a 68 y.o. female presents with difficulty breathing and mild hypoxia.  Possibly dx with COPD this week (at insurance physical) and known hx HTN, IDDM, CHF, CVA.  Pt reports increased difficulty breathing x5 days with associates cough, SOB, wheezing.  She was given an albuterol inhaler which she has been using at home without relief.  Pt also c/o orthopnea.  Cough is productive.    X-ray with vascular congestion.  Swelling and edema in thighs and abd.  Albuterol without relief.  Nitropaste and lasix ordered.  Outpatient failure at this time.  Plan: labs pending.  Will plan for admission.      Face to face Exam:   General: Awake  HEENT: Atraumatic  Resp: Increased effort, persistent expiratory wheezing Cardiac: RRR  Abd: Nondistended  Neuro:No focal weakness  Lymph: No adenopathy  7:19 AM Pt to be admitted to [facility].  Continuous Neb pending.       Dahlia ClientHannah Jewelz Ricklefs, PA-C 10/27/13 [phone]

## 2013-10-27 NOTE — H&P (Signed)
[facility] History and Physical  [name] ZOX:[id number] DOB: Jun 28, 1946 DOA: 10/27/2013   PCP: Evlyn Courier, MD   Chief Complaint: sob  HPI:  68 year old female with a history of hypertension, diabetes mellitus type 2, hyperlipidemia, stroke, hypothyroidism, COPD presents with three-day history of worsening shortness of breath. The patient went to see her primary care provider on 10/24/2013. The patient was placed on prednisone 30 mg daily x5 days and given a prescription for azithromycin. The patient took these medications over the next 2 days. She had no significant improvement. Her breathing got worse patient had increasing cough. She had scant blood-tinged sputum because of her worsening cough. As a result, the patient came to the ED for further evaluation. She had subjective fevers and chills without any nausea, vomiting, diarrhea, abdominal pain, dysuria, hematuria. She denies any chest pain or chest discomfort. There is no headache or dizziness.  In ED, the patient was noted to have significant wheezing. WBC was 9.0. BMP was unremarkable. Initial troponin was negative. ProBNP was 38. EKG showed sinus rhythm with T-wave inversion in II, III, avF, V3-V6. The patient was given furosemide 20 mg IV x1. She was started on 1 hour long nebulizer and given prednisone 60 mg by mouth. She was not in any respiratory distress. Oxygen saturation 95% on 2 L. Assessment/Plan: Acute COPD exacerbation -Start intravenous Solu-Medrol -Continue azithromycin -Continue aerosolized albuterol and Atrovent -Supplemental oxygen -40-pack-year history, quit 7 years ago Diabetes mellitus type 2 -Hemoglobin A1c -Restart Lantus -NovoLog sliding scale Hypertension -Hold amlodipine and HCTZ as the patient's blood pressure dropped after furosemide IV in the ED -Continue irbesartan -monitor for restart of amlodipine and HCTZ Abnormal EKG -The patient has no chest discomfort -Cycle troponins   Hypothyroidism -Check TSH -Continue Synthroid History of stroke -Right thalamic 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 Hyperlipidemia   . Thyroid disease   . Hypertension   . Ear infection   . Sinus problem   . CHF (congestive heart failure)   . Diverticula, intestine   . Complication of anesthesia     had bronch spasms post bladder surgery-had neb tx-that was the only time this happened  . Wears glasses   . Wears dentures     top-partial lower  . PONV (postoperative nausea and vomiting)   . Stroke 2013  . Hypothyroidism   . Depression   . Anxiety   . GERD (gastroesophageal reflux disease)   . Diabetes mellitus     fasting blood sugar 130s  . Sickle cell anemia     trait   Past Surgical History  Procedure Laterality Date  . Lipoma resection  July 2012    8cm, 4cm Left neck, right upper back  . Abdominal hysterectomy    . Bladder suspension    . Bilateral oophorectomy    . Thyroidectomy, partial  1985  . Breast lumpectomy with needle localization Left 11/29/2012    Procedure: BREAST LUMPECTOMY WITH NEEDLE LOCALIZATION;  Surgeon: Shelly Rubenstein, MD;  [facility];  Service: General;  Laterality: Left;  . Orif distal radius fracture Left 02/26/2013  . Orif wrist fracture Left 02/26/2013    Procedure: OPEN REDUCTION INTERNAL FIXATION (ORIF) WRIST FRACTURE;  Surgeon: Sharma Covert, MD;  Location: [facility] OR;  Service: Orthopedics;  Laterality: Left;   Social History:  reports that she quit smoking about 8 years ago. She has never used smokeless tobacco. She reports  that she drinks alcohol. She reports that she does not use illicit drugs.   Family History  Problem Relation Age of Onset  . Heart disease Mother   . Hypertension Mother   . Diabetes Mother   . Heart disease Father   . Hypertension Father   . Diabetes Father   . Heart disease Sister   . Hypertension Sister   . Diabetes Sister   .  Hypertension Brother   . Cancer Brother   . Diabetes Sister   . Hypertension Sister   . Diabetes Sister   . Hypertension Sister      Allergies  Allergen Reactions  . Sulfonamide Derivatives Shortness Of Breath  . Codeine Itching      Prior to Admission medications   Medication Sig Start Date End Date Taking? Authorizing Provider  amLODipine (NORVASC) 10 MG tablet Take 1 tablet (10 mg total) by mouth daily. 10/03/12  Yes Clanford Cyndie Mull, MD  azithromycin (ZITHROMAX) 250 MG tablet Take 250 mg by mouth daily.   Yes Historical Provider, MD  clopidogrel (PLAVIX) 75 MG tablet Take 1 tablet (75 mg total) by mouth daily. 10/03/12  Yes Clanford Cyndie Mull, MD  docusate sodium (COLACE) 100 MG capsule Take 1 capsule (100 mg total) by mouth 2 (two) times daily. 02/26/13  Yes Sharma Covert, MD  Fluticasone Furoate-Vilanterol (BREO ELLIPTA) 100-25 MCG/INH AEPB Inhale 1 puff into the lungs daily.   Yes Historical Provider, MD  glimepiride (AMARYL) 4 MG tablet Take 1 tablet (4 mg total) by mouth daily before breakfast. 10/03/12  Yes Clanford Cyndie Mull, MD  hydrochlorothiazide (HYDRODIURIL) 25 MG tablet Take 1 tablet (25 mg total) by mouth daily. 10/03/12  Yes Clanford Cyndie Mull, MD  insulin glargine (LANTUS) 100 UNIT/ML injection Inject 50 Units into the skin daily. 11/15/12  Yes Dorothea Ogle, MD  irbesartan (AVAPRO) 150 MG tablet Take 150 mg by mouth daily.   Yes Historical Provider, MD  montelukast (SINGULAIR) 10 MG tablet Take 10 mg by mouth at bedtime.   Yes Historical Provider, MD  pantoprazole (PROTONIX) 40 MG tablet Take 40 mg by mouth daily.   Yes Historical Provider, MD  pravastatin (PRAVACHOL) 10 MG tablet Take 20 mg by mouth at bedtime.    Yes Historical Provider, MD  predniSONE (DELTASONE) 10 MG tablet Take 30 mg by mouth daily with breakfast.   Yes Historical Provider, MD  PROAIR HFA 108 (90 BASE) MCG/ACT inhaler Inhale 1 puff into the lungs every 6 (six) hours as needed for wheezing or  shortness of breath.  10/24/13  Yes Historical Provider, MD  SYNTHROID 112 MCG tablet Take 1 tablet (112 mcg total) by mouth daily. 10/21/12  Yes Clanford Cyndie Mull, MD    Review of Systems:  Constitutional:  No weight loss, night sweats,  Head&Eyes: No headache.  No vision loss.  No eye pain or scotoma ENT:  No Difficulty swallowing,Tooth/dental problems,Sore throat,  No ear ache, post nasal drip,  Cardio-vascular:  No chest pain, Orthopnea, PND, swelling in lower extremities,  dizziness, palpitations  GI:  No  abdominal pain, nausea, vomiting, diarrhea, loss of appetite, hematochezia, melena, heartburn, indigestion, Resp:  Complains of shortness of breath, wheezing, scant hemoptysis. Skin:  no rash or lesions.  GU:  no dysuria, change in color of urine, no urgency or frequency. No flank pain.  Musculoskeletal:  No joint pain or swelling. No decreased range of motion. No back pain.  Psych:  No change in mood or affect. No  depression or anxiety. Neurologic: No headache, no dysesthesia, no focal weakness, no vision loss. No syncope  Physical Exam: Filed Vitals:   10/27/13 0458 10/27/13 0515 10/27/13 0530 10/27/13 0639  BP: 141/66 134/73 112/53 115/64  Pulse: 89 86 85 87  Temp: 97.5 F (36.4 C)     TempSrc: Oral     Resp: 22   14  Height: 5\' 4"  (1.626 m)     Weight: 100.245 kg (221 lb)     SpO2: 93% 97% 99% 95%   General:  A&O x 3, NAD, nontoxic, pleasant/cooperative Head/Eye: No conjunctival hemorrhage, no icterus, [place]/AT, No nystagmus ENT:  No icterus,  No thrush, good dentition, no pharyngeal exudate Neck:  No masses, shotty anterior cervical lymphadenpathy, CV:  RRR, no rub, no gallop, no S3 Lung:  Scattered rhonchi, left greater than right. Scattered wheezing. Good air movement. Abdomen: soft/NT, +BS, nondistended, no peritoneal signs Ext: No cyanosis, No rashes, No petechiae, No lymphangitis, No edema   Labs on Admission:  Basic Metabolic Panel:  Recent  Labs Lab 10/27/13 0535  NA 141  K 3.6*  CL 100  CO2 26  GLUCOSE 203*  BUN 15  CREATININE 0.73  CALCIUM 9.4   Liver Function Tests: No results found for this basename: AST, ALT, ALKPHOS, BILITOT, PROT, ALBUMIN,  in the last 168 hours No results found for this basename: LIPASE, AMYLASE,  in the last 168 hours No results found for this basename: AMMONIA,  in the last 168 hours CBC:  Recent Labs Lab 10/27/13 0535  WBC 9.0  NEUTROABS 3.4  HGB 13.3  HCT 40.7  MCV 85.1  PLT 175   Cardiac Enzymes:  Recent Labs Lab 10/27/13 0535  TROPONINI <0.30   BNP: No components found with this basename: POCBNP,  CBG: No results found for this basename: GLUCAP,  in the last 168 hours  Radiological Exams on Admission: Dg Chest 2 View  10/27/2013   CLINICAL DATA:  Shortness of breath, wheezing and nasal congestion. Fever and cough.  EXAM: CHEST  2 VIEW  COMPARISON:  Chest radiograph performed 02/24/2013  FINDINGS: The lungs are well-aerated. Minimal bilateral atelectasis is noted. There is no evidence of pleural effusion or pneumothorax.  The heart is normal in size; the mediastinal contour is within normal limits. No acute osseous abnormalities are seen.  IMPRESSION: Minimal bilateral atelectasis noted; lungs otherwise clear.   Electronically Signed   By: Roanna RaiderJeffery  Chang M.D.   On: 10/27/2013 06:37    EKG: Independently reviewed. Sinus rhythm, T-wave inversion V3 to V6, II,III,aVF    Time spent:60 minutes Code Status:   FULL Family Communication:  No Family at bedside   Monya Kozakiewicz, DO  [facility] Pager [phone]  If 7PM-7AM, please contact night-coverage [email or web address] Password [email or web address] 10/27/2013, 7:37 AM

## 2013-10-27 NOTE — ED Notes (Signed)
1 inch nitro paste taken off pt. Order discontinued per Lavella LemonsManly, MD.

## 2013-10-27 NOTE — ED Notes (Signed)
Spoke with Lawson FiscalLori, RN on 3E. Pt can come to floor at this time. Has ordered duoneb and albuterol treatment.

## 2013-10-27 NOTE — ED Notes (Signed)
Pt has finished her neb treatment. Wheezing still presents, reports breathing better. RT to bedside to evaluate patient, reports pt has improved but wheezing still present.

## 2013-10-27 NOTE — ED Notes (Signed)
Pt to remain in the department until her neb treatment has finished. Jacki ConesLaurie, RN on the floor is aware, as well as Maralyn SagoSarah, Charity fundraiserN and Efraim KaufmannMelissa, Charity fundraiserN.

## 2013-10-27 NOTE — Progress Notes (Signed)
UR completed Tayshawn Purnell K. Kalika Smay, RN, BSN, MSHL, CCM  10/27/2013 4:07 PM

## 2013-10-27 NOTE — Progress Notes (Signed)
Pt Stable, VSS.  Patient removed nasal cannula. States ability to breathe better.

## 2013-10-27 NOTE — Progress Notes (Signed)
I co-sign Sheneka Jones notes and assessments 

## 2013-10-27 NOTE — Care Management Note (Addendum)
    Page 1 of 1   10/29/2013     1:45:38 PM   CARE MANAGEMENT NOTE 10/29/2013  Patient:  [name],[name]   Account Number:  [id number]  Date Initiated:  10/27/2013  Documentation initiated by:  HUTCHINSON,CRYSTAL  Subjective/Objective Assessment:   Admitted with COPD Exacerbation     Action/Plan:   CM to follow for disposition needs.   Anticipated DC Date:  10/30/2013   Anticipated DC Plan:  HOME/SELF CARE         Choice offered to / List presented to:             Status of service:  In process, will continue to follow Medicare Important Message given?   (If response is "NO", the following Medicare IM given date fields will be blank) Date Medicare IM given:   Date Additional Medicare IM given:    Discharge Disposition:    Per UR Regulation:  Reviewed for med. necessity/level of care/duration of stay  If discussed at Long Length of Stay Meetings, dates discussed:    Comments:  10/29/13 1330 Brenda Cohnamellia Michol Emory, RN, BSN, [place]CM [phone] Spoke with pt at bedside regarding discharge planning.  Pt states that she has a handle on her medications and will follow-up with a pulmonologist and access need for nebulizer from there.  No other CM needs identified at this time.

## 2013-10-27 NOTE — ED Notes (Signed)
C/o cough, sob, wheezing, subjective fever, congestion. Onset Thursday. [facility] Not getting better. Last inhaler use was 1900. Taking tylenol for subjective fever. Describes cough as "productive, clear, hard to cough up,  lots of mucus".

## 2013-10-27 NOTE — ED Provider Notes (Signed)
Medical screening examination/treatment/procedure(s) were conducted as a shared visit with non-physician practitioner(s) and myself.  I personally evaluated the patient during the encounter.     Brandt LoosenJulie Manly, MD 10/27/13 [phone]

## 2013-10-27 NOTE — ED Provider Notes (Signed)
CSN: [id number]     Arrival date & time 10/27/13  1324 History   First MD Initiated Contact with Patient 10/27/13 0518     Chief Complaint  Patient presents with  . Shortness of Breath  . Wheezing  . Nasal Congestion  . Fever  . Cough     (Consider location/radiation/quality/duration/timing/severity/associated sxs/prior Treatment) HPI Comments: Patient really recently diagnosed with COPD, and started on inhalers, 2, days ago, at [facility], and started on azithromycin steroid taper, and increase use of albuterol rescue inhaler.  Tonight.  Her breathing became worse, wheezing, has not resolved with use of her inhaler. She has a harsh cough with minimal sputum production, a subjective fever. When she was seen at [facility] on Friday.  No x-ray was performed.  On arrival to our emergency department.  Her O2 sats is 93% in she appeared to be uncomfortable breathing, with audible wheezing and stridor.  Patient is a 68 y.o. female presenting with shortness of breath, wheezing, fever, and cough. The history is provided by the patient.  Shortness of Breath Severity:  Moderate Onset quality:  Gradual Timing:  Intermittent Progression:  Worsening Chronicity:  New Context: activity   Relieved by:  Nothing Worsened by:  Activity Ineffective treatments:  Inhaler and position changes Associated symptoms: cough, fever and wheezing   Associated symptoms: no abdominal pain, no chest pain, no headaches and no rash   Cough:    Cough characteristics:  Non-productive and paroxysmal   Severity:  Moderate   Onset quality:  Gradual   Timing:  Intermittent   Progression:  Worsening   Chronicity:  New Wheezing:    Severity:  Moderate   Onset quality:  Gradual   Timing:  Intermittent   Progression:  Waxing and waning   Chronicity:  New Risk factors: obesity   Wheezing Associated symptoms: cough, fever, shortness of breath and stridor   Associated symptoms: no chest pain, no  headaches and no rash   Fever Associated symptoms: cough   Associated symptoms: no chest pain, no headaches and no rash   Cough Associated symptoms: fever, shortness of breath and wheezing   Associated symptoms: no chest pain, no headaches and no rash     Past Medical History  Diagnosis Date  . Hyperlipidemia   . Thyroid disease   . Hypertension   . Ear infection   . Sinus problem   . CHF (congestive heart failure)   . Diverticula, intestine   . Complication of anesthesia     had bronch spasms post bladder surgery-had neb tx-that was the only time this happened  . Wears glasses   . Wears dentures     top-partial lower  . PONV (postoperative nausea and vomiting)   . Stroke 2013  . Hypothyroidism   . Depression   . Anxiety   . GERD (gastroesophageal reflux disease)   . Diabetes mellitus     fasting blood sugar 130s  . Sickle cell anemia     trait   Past Surgical History  Procedure Laterality Date  . Lipoma resection  July 2012    8cm, 4cm Left neck, right upper back  . Abdominal hysterectomy    . Bladder suspension    . Bilateral oophorectomy    . Thyroidectomy, partial  1985  . Breast lumpectomy with needle localization Left 11/29/2012    Procedure: BREAST LUMPECTOMY WITH NEEDLE LOCALIZATION;  Surgeon: Shelly Rubenstein, MD;  Location: [facility];  Service: General;  Laterality: Left;  . Orif distal radius fracture Left 02/26/2013  . Orif wrist fracture Left 02/26/2013    Procedure: OPEN REDUCTION INTERNAL FIXATION (ORIF) WRIST FRACTURE;  Surgeon: Sharma Covert, MD;  Location: [facility] OR;  Service: Orthopedics;  Laterality: Left;   Family History  Problem Relation Age of Onset  . Heart disease Mother   . Hypertension Mother   . Diabetes Mother   . Heart disease Father   . Hypertension Father   . Diabetes Father   . Heart disease Sister   . Hypertension Sister   . Diabetes Sister   . Hypertension Brother   . Cancer Brother   . Diabetes Sister   .  Hypertension Sister   . Diabetes Sister   . Hypertension Sister    History  Substance Use Topics  . Smoking status: Former Smoker    Quit date: 07/11/2005  . Smokeless tobacco: Never Used  . Alcohol Use: Yes     Comment: occasional   OB History   Grav Para Term Preterm Abortions TAB SAB Ect Mult Living   1 1 1       1      Review of Systems  Constitutional: Positive for fever.  HENT: Negative for postnasal drip.   Respiratory: Positive for cough, shortness of breath, wheezing and stridor.   Cardiovascular: Negative for chest pain and leg swelling.  Gastrointestinal: Negative for abdominal pain.  Skin: Negative for rash.  Neurological: Negative for headaches.      Allergies  Sulfonamide derivatives and Codeine  Home Medications   Current Outpatient Rx  Name  Route  Sig  Dispense  Refill  . amLODipine (NORVASC) 10 MG tablet   Oral   Take 1 tablet (10 mg total) by mouth daily.   30 tablet   3   . azithromycin (ZITHROMAX) 250 MG tablet   Oral   Take 250 mg by mouth daily.         . clopidogrel (PLAVIX) 75 MG tablet   Oral   Take 1 tablet (75 mg total) by mouth daily.   30 tablet   3   . docusate sodium (COLACE) 100 MG capsule   Oral   Take 1 capsule (100 mg total) by mouth 2 (two) times daily.   30 capsule   0   . Fluticasone Furoate-Vilanterol (BREO ELLIPTA) 100-25 MCG/INH AEPB   Inhalation   Inhale 1 puff into the lungs daily.         Marland Kitchen glimepiride (AMARYL) 4 MG tablet   Oral   Take 1 tablet (4 mg total) by mouth daily before breakfast.   30 tablet   3   . hydrochlorothiazide (HYDRODIURIL) 25 MG tablet   Oral   Take 1 tablet (25 mg total) by mouth daily.   30 tablet   3   . insulin glargine (LANTUS) 100 UNIT/ML injection   Subcutaneous   Inject 50 Units into the skin daily.   10 mL   3   . irbesartan (AVAPRO) 150 MG tablet   Oral   Take 150 mg by mouth daily.         . montelukast (SINGULAIR) 10 MG tablet   Oral   Take 10 mg by  mouth at bedtime.         . pantoprazole (PROTONIX) 40 MG tablet   Oral   Take 40 mg by mouth daily.         . pravastatin (PRAVACHOL) 10 MG tablet  Oral   Take 20 mg by mouth at bedtime.          . predniSONE (DELTASONE) 10 MG tablet   Oral   Take 30 mg by mouth daily with breakfast.         . PROAIR HFA 108 (90 BASE) MCG/ACT inhaler   Inhalation   Inhale 1 puff into the lungs every 6 (six) hours as needed for wheezing or shortness of breath.          . SYNTHROID 112 MCG tablet   Oral   Take 1 tablet (112 mcg total) by mouth daily.   30 tablet   3     Dispense as written.    BP 112/53  Pulse 85  Temp(Src) 97.5 F (36.4 C) (Oral)  Resp 22  Ht 5\' 4"  (1.626 m)  Wt 221 lb (100.245 kg)  BMI 37.92 kg/m2  SpO2 99% Physical Exam  Constitutional: She appears well-developed and well-nourished. No distress.  HENT:  Head: Normocephalic and atraumatic.  Eyes: Pupils are equal, round, and reactive to light.  Neck: Normal range of motion.  Cardiovascular: Normal rate and regular rhythm.   Pulmonary/Chest: No respiratory distress. She has wheezes. She exhibits no tenderness.  Neurological: She is alert.  Skin: Skin is warm. No rash noted.    ED Course  Procedures (including critical care time) Labs Review Labs Reviewed  CBC WITH DIFFERENTIAL - Abnormal; Notable for the following:    Neutrophils Relative % 38 (*)    Lymphocytes Relative 57 (*)    Lymphs Abs 5.1 (*)    All other components within normal limits  BASIC METABOLIC PANEL   Imaging Review No results found.  EKG Interpretation   None       MDM  Patient is currently getting albuterol, Atrovent neb treatment, labs and x-ray pending Patient hasn't had no improvement with the albuterol treatment.  She does report, that she's had some swelling in her thighs and abdomen.  She is only taking HCTZ and Norvacs for HTN Will give IV Lasix and topical nitro and reevaluate. This patinet will most likely  be admitted for better medical management  Final diagnoses:  None         Arman FilterGail K Dequane Strahan, NP 10/27/13 [phone]

## 2013-10-27 NOTE — ED Provider Notes (Addendum)
Medical screening examination/treatment/procedure(s) were conducted as a shared visit with non-physician practitioner(s) and myself.  I personally evaluated the patient during the encounter.  68 yo woman with recently diagnosed COPD who also notes a history of CHF. Here with several days of worsening wheezing and SOB despite use of Albuterol HFA and steroid burst. Patient has a 40  pack year smoking history but also notes that she has a history of CHF. In the ED she has had an inadequate response to multiple nebs and continues to having wheezing in all fields.  RR 24 and RA sats 95%. Patient feels continuously SOB with any exertion. We will thus admit for further evaluation and management.    EKG: NSR, rate 90 bpm, normal axis, prolonged QT interval, diffuse T wave inversions.    Brandt LoosenJulie Manly, MD 10/27/13 [phone]  Case discussed with Dr. Arbutus Leasat who will admit.   Brandt LoosenJulie Manly, MD 10/27/13 [phone]

## 2013-10-28 DIAGNOSIS — E119 Type 2 diabetes mellitus without complications: Secondary | ICD-10-CM

## 2013-10-28 LAB — BASIC METABOLIC PANEL
BUN: 16 mg/dL (ref 6–23)
CHLORIDE: 99 meq/L (ref 96–112)
CO2: 23 mEq/L (ref 19–32)
Calcium: 9.5 mg/dL (ref 8.4–10.5)
Creatinine, Ser: 0.62 mg/dL (ref 0.50–1.10)
GFR calc Af Amer: >90 mL/min (ref >90)
Glucose, Bld: 309 mg/dL — ABNORMAL HIGH (ref 70–99)
Potassium: 4.4 mEq/L (ref 3.7–5.3)
Sodium: 138 mEq/L (ref 137–147)

## 2013-10-28 LAB — GLUCOSE, CAPILLARY
GLUCOSE-CAPILLARY: 320 mg/dL — AB (ref 70–99)
GLUCOSE-CAPILLARY: 457 mg/dL — AB (ref 70–99)
Glucose-Capillary: 267 mg/dL — ABNORMAL HIGH (ref 70–99)

## 2013-10-28 MED ORDER — IPRATROPIUM-ALBUTEROL 0.5-2.5 (3) MG/3ML IN SOLN
3.0000 mL | Freq: Four times a day (QID) | RESPIRATORY_TRACT | Status: DC
  Administered 2013-10-28: 3 mL via RESPIRATORY_TRACT
  Filled 2013-10-28: qty 3

## 2013-10-28 MED ORDER — FLUCONAZOLE 100 MG PO TABS
100.0000 mg | ORAL_TABLET | Freq: Every day | ORAL | Status: DC
  Administered 2013-10-28 – 2013-10-29 (×2): 100 mg via ORAL
  Filled 2013-10-28 (×3): qty 1

## 2013-10-28 MED ORDER — PREDNISONE 20 MG PO TABS
40.0000 mg | ORAL_TABLET | Freq: Every day | ORAL | Status: DC
  Administered 2013-10-29: 40 mg via ORAL
  Filled 2013-10-28 (×2): qty 2

## 2013-10-28 MED ORDER — TIOTROPIUM BROMIDE MONOHYDRATE 18 MCG IN CAPS
18.0000 ug | ORAL_CAPSULE | Freq: Every day | RESPIRATORY_TRACT | Status: DC
  Administered 2013-10-28 – 2013-10-29 (×2): 18 ug via RESPIRATORY_TRACT
  Filled 2013-10-28: qty 5

## 2013-10-28 MED ORDER — IPRATROPIUM-ALBUTEROL 0.5-2.5 (3) MG/3ML IN SOLN: 3.0000 mL | Freq: Three times a day (TID) | RESPIRATORY_TRACT | Status: DC

## 2013-10-28 MED ORDER — INSULIN ASPART 100 UNIT/ML ~~LOC~~ SOLN
0.0000 [IU] | Freq: Three times a day (TID) | SUBCUTANEOUS | Status: DC
  Administered 2013-10-28: 20 [IU] via SUBCUTANEOUS
  Administered 2013-10-29: 15 [IU] via SUBCUTANEOUS
  Administered 2013-10-29: 4 [IU] via SUBCUTANEOUS

## 2013-10-28 MED ORDER — LISINOPRIL 10 MG PO TABS
10.0000 mg | ORAL_TABLET | Freq: Every day | ORAL | Status: DC
  Administered 2013-10-28: 10 mg via ORAL
  Filled 2013-10-28 (×3): qty 1

## 2013-10-28 MED ORDER — MOMETASONE FURO-FORMOTEROL FUM 100-5 MCG/ACT IN AERO
2.0000 | INHALATION_SPRAY | Freq: Two times a day (BID) | RESPIRATORY_TRACT | Status: DC
  Administered 2013-10-28 – 2013-10-29 (×3): 2 via RESPIRATORY_TRACT
  Filled 2013-10-28: qty 8.8

## 2013-10-28 MED ORDER — GUAIFENESIN ER 600 MG PO TB12
600.0000 mg | ORAL_TABLET | Freq: Two times a day (BID) | ORAL | Status: DC
  Administered 2013-10-28 – 2013-10-29 (×3): 600 mg via ORAL
  Filled 2013-10-28 (×5): qty 1

## 2013-10-28 MED ORDER — AMLODIPINE BESYLATE 10 MG PO TABS: 10.0000 mg | ORAL_TABLET | Freq: Every day | ORAL | Status: DC

## 2013-10-28 MED ORDER — INSULIN ASPART 100 UNIT/ML ~~LOC~~ SOLN
6.0000 [IU] | Freq: Three times a day (TID) | SUBCUTANEOUS | Status: DC
  Administered 2013-10-28 – 2013-10-29 (×3): 6 [IU] via SUBCUTANEOUS

## 2013-10-28 MED ORDER — HYDROCHLOROTHIAZIDE 25 MG PO TABS
25.0000 mg | ORAL_TABLET | Freq: Every day | ORAL | Status: DC
  Administered 2013-10-28 – 2013-10-29 (×2): 25 mg via ORAL
  Filled 2013-10-28 (×3): qty 1

## 2013-10-28 MED ORDER — INSULIN ASPART 100 UNIT/ML ~~LOC~~ SOLN
0.0000 [IU] | Freq: Every day | SUBCUTANEOUS | Status: DC
  Administered 2013-10-28: 4 [IU] via SUBCUTANEOUS

## 2013-10-28 NOTE — Progress Notes (Signed)
[facility] PROGRESS NOTE  Assessment/Plan:  COPD with acute exacerbation - change steroids to oral.s - start spiriva, albuterol and antibiotics. - ambulate and check sats.  Candida vaginitis: - diflucan.  Type 2 diabetes mellitus with vascular disease -  Increase SSI. Cont lantus. - poorly controlled, Blood glucose. - d/c norvasc start lisinopril   HYPERTENSION -  High today. - restart lisinopril, cont HCTZ.  Code Status: full Family Communication: none  Disposition Plan: inpatinet   Consultants:  none  Procedures:  none  Antibiotics:  azithro  HPI/Subjective: Breathing is better  Objective: Filed Vitals:   10/27/13 2110 10/28/13 0026 10/28/13 0521 10/28/13 0857  BP: 144/72  160/85   Pulse: 95  92   Temp: 98.5 F (36.9 C)  98.1 F (36.7 C)   TempSrc: Oral  Oral   Resp: 20  20   Height:      Weight:   95.89 kg (211 lb 6.4 oz)   SpO2: 93% 94% 93% 95%    Intake/Output Summary (Last 24 hours) at 10/28/13 1215 Last data filed at 10/28/13 1210  Gross per 24 hour  Intake    702 ml  Output   3100 ml  Net  -2398 ml   Filed Weights   10/27/13 0458 10/27/13 0902 10/28/13 0521  Weight: 100.245 kg (221 lb) 97.115 kg (214 lb 1.6 oz) 95.89 kg (211 lb 6.4 oz)    Exam:  General: Alert, awake, oriented x3, in no acute distress.  HEENT: No bruits, no goiter.  Heart: Regular rate and rhythm, without murmurs, rubs, gallops.  Lungs: Good air movement, wheezing Abdomen: Soft, nontender, nondistended, positive bowel sounds.  Neuro: Grossly intact, nonfocal.   Data Reviewed: Basic Metabolic Panel:  Recent Labs Lab 10/27/13 0535 10/28/13 0340  NA 141 138  K 3.6* 4.4  CL 100 99  CO2 26 23  GLUCOSE 203* 309*  BUN 15 16  CREATININE 0.73 0.62  CALCIUM 9.4 9.5   Liver Function Tests: No results found for this basename: AST, ALT, ALKPHOS, BILITOT, PROT, ALBUMIN,  in the last 168 hours No results found for this basename: LIPASE, AMYLASE,  in  the last 168 hours No results found for this basename: AMMONIA,  in the last 168 hours CBC:  Recent Labs Lab 10/27/13 0535  WBC 9.0  NEUTROABS 3.4  HGB 13.3  HCT 40.7  MCV 85.1  PLT 175   Cardiac Enzymes:  Recent Labs Lab 10/27/13 0535 10/27/13 1129 10/27/13 1715  TROPONINI <0.30 <0.30 <0.30   BNP (last 3 results)  Recent Labs  10/27/13 0535  PROBNP 38.5   CBG:  Recent Labs Lab 10/27/13 1119 10/27/13 1621 10/27/13 2108 10/28/13 0542  GLUCAP 264* 370* 352* 267*    No results found for this or any previous visit (from the past 240 hour(s)).   Studies: Dg Chest 2 View  10/27/2013   CLINICAL DATA:  Shortness of breath, wheezing and nasal congestion. Fever and cough.  EXAM: CHEST  2 VIEW  COMPARISON:  Chest radiograph performed 02/24/2013  FINDINGS: The lungs are well-aerated. Minimal bilateral atelectasis is noted. There is no evidence of pleural effusion or pneumothorax.  The heart is normal in size; the mediastinal contour is within normal limits. No acute osseous abnormalities are seen.  IMPRESSION: Minimal bilateral atelectasis noted; lungs otherwise clear.   Electronically Signed   By: Roanna Raider M.D.   On: 10/27/2013 06:37    Scheduled Meds: . azithromycin  250 mg Oral Daily  .  clopidogrel  75 mg Oral Daily  . enoxaparin (LOVENOX) injection  40 mg Subcutaneous Q24H  . fluconazole  100 mg Oral Daily  . furosemide  20 mg Intravenous Once  . guaiFENesin  600 mg Oral BID  . insulin aspart  0-15 Units Subcutaneous TID WC  . insulin aspart  0-5 Units Subcutaneous QHS  . insulin glargine  50 Units Subcutaneous Daily  . irbesartan  150 mg Oral QHS  . levothyroxine  112 mcg Oral QAC breakfast  . mometasone-formoterol  2 puff Inhalation BID  . montelukast  10 mg Oral QHS  . nitroGLYCERIN  1 inch Topical Once  . pantoprazole  40 mg Oral Daily  . [START ON 10/29/2013] predniSONE  40 mg Oral Q breakfast  . simvastatin  10 mg Oral q1800  . sodium chloride  3  mL Intravenous Q12H  . tiotropium  18 mcg Inhalation Daily   Continuous Infusions:    Marinda ElkFELIZ ORTIZ, Renalda Locklin  [facility] Pager [phone]. If 8PM-8AM, please contact night-coverage at [email or web address], password [email or web address] 10/28/2013, 12:15 PM  LOS: 1 day

## 2013-10-28 NOTE — Progress Notes (Signed)
Pt very upset at 22:45, pt refers she found some blood on her bed covers that didn't belong to her and she want a new mattress, CN notified and mattress requested from environmental service. Mattress still not available at this time and pt requested to call nurse supervisor,  Bed changed and nurse supervisor talked and apologizes with the pt. Pt denies any pain or discomfort, pt back on her bed no distress noticed. We'll continue with POC.

## 2013-10-28 NOTE — Progress Notes (Signed)
Dr. Illene SilverFelitz notified about BG at 457

## 2013-10-28 NOTE — Progress Notes (Signed)
Pt a/o, c/o HA PRN tylenol given with good affect, pt also c/o odor and itching to vaginal area and she states "i think its a yeast infection" Dr. Radonna RickerFeliz notified, Diflucan ordered, pt stable

## 2013-10-29 DIAGNOSIS — I6350 Cerebral infarction due to unspecified occlusion or stenosis of unspecified cerebral artery: Secondary | ICD-10-CM

## 2013-10-29 DIAGNOSIS — I10 Essential (primary) hypertension: Secondary | ICD-10-CM

## 2013-10-29 DIAGNOSIS — K219 Gastro-esophageal reflux disease without esophagitis: Secondary | ICD-10-CM

## 2013-10-29 DIAGNOSIS — J984 Other disorders of lung: Secondary | ICD-10-CM

## 2013-10-29 LAB — GLUCOSE, CAPILLARY
GLUCOSE-CAPILLARY: 349 mg/dL — AB (ref 70–99)
GLUCOSE-CAPILLARY: 260 mg/dL — AB (ref 70–99)
Glucose-Capillary: 312 mg/dL — ABNORMAL HIGH (ref 70–99)
Glucose-Capillary: 182 mg/dL — ABNORMAL HIGH (ref 70–99)

## 2013-10-29 MED ORDER — GUAIFENESIN ER 600 MG PO TB12: 600.0000 mg | ORAL_TABLET | Freq: Two times a day (BID) | ORAL | Status: AC

## 2013-10-29 MED ORDER — PREDNISONE 20 MG PO TABS: ORAL_TABLET | ORAL | Status: DC

## 2013-10-29 MED ORDER — AZITHROMYCIN 250 MG PO TABS: ORAL_TABLET | ORAL | Status: AC

## 2013-10-29 MED ORDER — FLUCONAZOLE 100 MG PO TABS: 100.0000 mg | ORAL_TABLET | Freq: Every day | ORAL | Status: DC

## 2013-10-29 MED ORDER — TIOTROPIUM BROMIDE MONOHYDRATE 18 MCG IN CAPS: 18.0000 ug | ORAL_CAPSULE | Freq: Every day | RESPIRATORY_TRACT | Status: AC

## 2013-10-29 MED ORDER — BUDESONIDE-FORMOTEROL FUMARATE 160-4.5 MCG/ACT IN AERO: 2.0000 | INHALATION_SPRAY | Freq: Two times a day (BID) | RESPIRATORY_TRACT | Status: AC

## 2013-10-29 NOTE — Progress Notes (Signed)
10/29/13 0700a-1600p Patient A/Ox4 and is ambulatory without assistance. She is on room air. Had no c/o pain.

## 2013-10-29 NOTE — Discharge Summary (Signed)
Physician Discharge Summary  [name] NWG:[id number] DOB: 05/03/1946 DOA: 10/27/2013  PCP: Dr. Leavy Cella, Tammy  Admit date: 10/27/2013 Discharge date: 10/29/2013  Time spent: >30 minutes  Recommendations for Outpatient Follow-up:  1. BMET to follow electrolytes and renal function 2. Reassess BP and adjust medications as needed 3. Close follow up to CBG's and further adjustments to hypoglycemic regimen. A1C 10.1 4. Patient advise to follow with pulmonary service for PFT's and further treatment of her COPD (please make sure she has follow recommendations)  Discharge Diagnoses:  Acute resp failure due to COPD exacerbation HYPOTHYROIDISM HYPERTENSION CVA (cerebral infarction) Type 2 diabetes mellitus with vascular disease HLD Candida vaginitis    Discharge Condition: stable and improved. Will discharge home. Follow up with PCP in 10 days and in about 2 weeks follow up with pulmonary service.  Diet recommendation: low sodium and low carbohydrates diet  Filed Weights   10/27/13 0902 10/28/13 0521 10/29/13 0518  Weight: 97.115 kg (214 lb 1.6 oz) 95.89 kg (211 lb 6.4 oz) 95.482 kg (210 lb 8 oz)    History of present illness:  68 year old female with a history of hypertension, diabetes mellitus type 2, hyperlipidemia, stroke, hypothyroidism, COPD presents with three-day history of worsening shortness of breath. The patient went to see her primary care provider on 10/24/2013. The patient was placed on prednisone 30 mg daily x5 days and given a prescription for azithromycin. The patient took these medications over the next 2 days. She had no significant improvement. Her breathing got worse patient had increasing cough. She had scant blood-tinged sputum because of her worsening cough. As a result, the patient came to the ED for further evaluation. She had subjective fevers and chills without any nausea, vomiting, diarrhea, abdominal pain, dysuria, hematuria. She denies any chest pain or chest  discomfort. There is no headache or dizziness.  In ED, the patient was noted to have significant wheezing. WBC was 9.0. BMP was unremarkable. Initial troponin was negative. ProBNP was 38. EKG showed sinus rhythm with T-wave inversion in II, III, avF, V3-V6. The patient was given furosemide 20 mg IV x1. She was started on 1 hour long nebulizer and given prednisone 60 mg by mouth. She was not in any respiratory distress. Oxygen saturation 95% on 2L.   Hospital Course:  COPD with acute exacerbation  - significant improvement in her breathing; good O2 sat on RA -will start tapering steroids, continue antibiotics and patient will be started on spiriva and symbicort -will benefit of pulmonary service evaluation in the outpatient setting (needs PFT's) change steroids to oral.s   Candida vaginitis:  -will treat with diflucan.   Type 2 diabetes mellitus with vascular disease  - poorly controlled, Blood glucose.  -A1C 10.1 - continue lantus, low carb diet and amaryl -close follow up with PCP for further adjustments to hypoglycemic regimen.  HYPERTENSION  -continue norvasc, HCTZ and avapro -patient advise to follow low sodium diet  HLD -continue statin  Hypothyroidism -continue synthroid  Hx of CVA -continue risk factors modifications -continue plavix for secondary prevention -no new deficit  *rest of medical problems remains stable and the plan is to continue current medication regimen. Follow up with PCP in 10 days.   Procedures:  See below for x-ray reports   Consultations:  None   Discharge Exam: Filed Vitals:   10/29/13 0900  BP: 134/72  Pulse:   Temp: 98.1 F (36.7 C)  Resp: 20    General: NAD, afebrile, breathing comfortable Cardiovascular: S1 and S2,  no rubs or gallops Respiratory: slight Exp wheezing; good air movement otherwise and normal O2 sat on RA Abdomen: soft, NT, ND, positive BS Extremities: no edema, no cyanosis Neurologic: non focal.  Discharge  Instructions  Discharge Orders   Future Appointments Provider Department Dept Phone   10/31/2013 12:30 PM Rosendo Gros, RN [facility] [phone]   Future Orders Complete By Expires   Ambulatory referral to Nutrition and Diabetic Education  As directed    Comments:     Elevated HgbA1C-  10%.   Diet - low sodium heart healthy  As directed    Discharge instructions  As directed    Comments:     Take medications as prescribed Follow with PCP in 2 weeks Arrange visit with pulmonologist as recommended Follow a low sodium and low carbohydrates diet       Medication List    STOP taking these medications       BREO ELLIPTA 100-25 MCG/INH Aepb  Generic drug:  Fluticasone Furoate-Vilanterol      TAKE these medications       amLODipine 10 MG tablet  Commonly known as:  NORVASC  Take 1 tablet (10 mg total) by mouth daily.     azithromycin 250 MG tablet  Commonly known as:  ZITHROMAX  Take 1 medication by mouth daily with food; take as instructed until 11/02/13     budesonide-formoterol 160-4.5 MCG/ACT inhaler  Commonly known as:  SYMBICORT  Inhale 2 puffs into the lungs 2 (two) times daily.     clopidogrel 75 MG tablet  Commonly known as:  PLAVIX  Take 1 tablet (75 mg total) by mouth daily.     docusate sodium 100 MG capsule  Commonly known as:  COLACE  Take 1 capsule (100 mg total) by mouth 2 (two) times daily.     fluconazole 100 MG tablet  Commonly known as:  DIFLUCAN  Take 1 tablet (100 mg total) by mouth daily.     glimepiride 4 MG tablet  Commonly known as:  AMARYL  Take 1 tablet (4 mg total) by mouth daily before breakfast.     guaiFENesin 600 MG 12 hr tablet  Commonly known as:  MUCINEX  Take 1 tablet (600 mg total) by mouth 2 (two) times daily.     hydrochlorothiazide 25 MG tablet  Commonly known as:  HYDRODIURIL  Take 1 tablet (25 mg total) by mouth daily.     insulin glargine 100 UNIT/ML injection  Commonly  known as:  LANTUS  Inject 50 Units into the skin daily.     irbesartan 150 MG tablet  Commonly known as:  AVAPRO  Take 150 mg by mouth daily.     montelukast 10 MG tablet  Commonly known as:  SINGULAIR  Take 10 mg by mouth at bedtime.     pantoprazole 40 MG tablet  Commonly known as:  PROTONIX  Take 40 mg by mouth daily.     pravastatin 10 MG tablet  Commonly known as:  PRAVACHOL  Take 20 mg by mouth at bedtime.     predniSONE 20 MG tablet  Commonly known as:  DELTASONE  Take 2 tablet by mouth daily X 2 days; then 1 tablet by mouth X 3 days; then 1/2 tablet by mouth daily X 3 days and stop prednisone     PROAIR HFA 108 (90 BASE) MCG/ACT inhaler  Generic drug:  albuterol  Inhale 1 puff into the lungs every 6 (six)  hours as needed for wheezing or shortness of breath.     SYNTHROID 112 MCG tablet  Generic drug:  levothyroxine  Take 1 tablet (112 mcg total) by mouth daily.     tiotropium 18 MCG inhalation capsule  Commonly known as:  SPIRIVA  Place 1 capsule (18 mcg total) into inhaler and inhale daily.       Allergies  Allergen Reactions  . Sulfonamide Derivatives Shortness Of Breath  . Codeine Itching       Follow-up Information   Follow up with [facility] In 2 weeks. (hospital follow up)    Specialty:  Pulmonology   Contact information:   [address] [place] [address] [phone]      Follow up with Evlyn CourierHILL,GERALD K, MD. Schedule an appointment as soon as possible for a visit in 10 days.   Specialty:  Family Medicine   Contact information:   [address] [place] [address] [phone]       The results of significant diagnostics from this hospitalization (including imaging, microbiology, ancillary and laboratory) are listed below for reference.    Significant Diagnostic Studies: Dg Chest 2 View  10/27/2013   CLINICAL DATA:  Shortness of breath, wheezing and nasal congestion. Fever and cough.  EXAM: CHEST  2 VIEW   COMPARISON:  Chest radiograph performed 02/24/2013  FINDINGS: The lungs are well-aerated. Minimal bilateral atelectasis is noted. There is no evidence of pleural effusion or pneumothorax.  The heart is normal in size; the mediastinal contour is within normal limits. No acute osseous abnormalities are seen.  IMPRESSION: Minimal bilateral atelectasis noted; lungs otherwise clear.   Electronically Signed   By: Roanna RaiderJeffery  Chang M.D.   On: 10/27/2013 06:37   Labs: Basic Metabolic Panel:  Recent Labs Lab 10/27/13 0535 10/28/13 0340  NA 141 138  K 3.6* 4.4  CL 100 99  CO2 26 23  GLUCOSE 203* 309*  BUN 15 16  CREATININE 0.73 0.62  CALCIUM 9.4 9.5   CBC:  Recent Labs Lab 10/27/13 0535  WBC 9.0  NEUTROABS 3.4  HGB 13.3  HCT 40.7  MCV 85.1  PLT 175   Cardiac Enzymes:  Recent Labs Lab 10/27/13 0535 10/27/13 1129 10/27/13 1715  TROPONINI <0.30 <0.30 <0.30   BNP: BNP (last 3 results)  Recent Labs  10/27/13 0535  PROBNP 38.5   CBG:  Recent Labs Lab 10/28/13 1209 10/28/13 1650 10/28/13 2048 10/29/13 0531 10/29/13 1135  GLUCAP 320* 457* 312* 182* 349*    Signed:  Ysabella Babiarz  [facility] 10/29/2013, 2:21 PM

## 2013-10-31 ENCOUNTER — Encounter: Payer: Medicare Other | Attending: Internal Medicine | Admitting: *Deleted

## 2013-10-31 ENCOUNTER — Encounter: Payer: Self-pay | Admitting: *Deleted

## 2013-10-31 VITALS — Ht 64.5 in | Wt 213.2 lb

## 2013-10-31 DIAGNOSIS — E119 Type 2 diabetes mellitus without complications: Secondary | ICD-10-CM

## 2013-10-31 NOTE — Progress Notes (Signed)
Appt start time: 1230 end time:  1400.  Assessment:  Patient was seen on  10/31/13 for individual diabetes education. Patient comes for DSME following recent hospitalization for pulmonary condition.[facility] She recalls having a nurse speak with her a little about T2DM. No further educations received. When [facility] closed she obtained her insulin through Urgent Care. She has since become eligible for Medicare and has obtained a Primary Care Provider.  Lives alone. Family near by "do their own thing". Recently lost a sister from heart disease/diabetes/resp.  Current HbA1c: 10.0%  Preferred Learning Style:   Auditory  Hands on  Learning Readiness:   Ready  MEDICATIONS: See List, Lantus, amaryl  DIETARY INTAKE: [name] does the shopping, cooking and does not eat out. Avoids beef and pork.  B (7 AM): egg, 2 toast, juice / oatmeal, juice Snk ( AM): none  L (1:00 PM): salade & chicken, string beans/pintos/ green peas, black eyes peas Snk ( PM): none D ( 7:00PM): salade, potatoes, greens, meat, fish (salmon) Snk (8:30 PM): cheese toast, apple butter toast, lactaid 2% milk, Beverages: OJ (50% less sugar), hot tea/splenda, coffee/splenda, water, diet soda  Usual physical activity: None due to pulmonary condition. Plans to attend pulmonary Rehabilitation soon   Intervention:  Nutrition counseling provided.  Discussed diabetes disease process and treatment options.  Discussed physiology of diabetes and role of obesity on insulin resistance.  Encouraged moderate weight reduction to improve glucose levels.  Discussed role of medications and diet in glucose control  Provided education on macronutrients on glucose levels.  Provided education on carb counting, importance of regularly scheduled meals/snacks, and meal planning  Discussed effects of physical activity on glucose levels and long-term glucose control.  Recommended 150 minutes of physical  activity/week.  Reviewed patient medications.  Discussed role of medication on blood glucose and possible side effects  Discussed blood glucose monitoring and interpretation.  Discussed recommended target ranges and individual ranges.    Described short-term complications: hyper- and hypo-glycemia.  Discussed causes,symptoms, and treatment options.  Discussed prevention, detection, and treatment of long-term complications.  Discussed the role of prolonged elevated glucose levels on body systems.  Discussed role of stress on blood glucose levels and discussed strategies to manage psychosocial issues.  Discussed recommendations for long-term diabetes self-care.  Established checklist for medical, dental, and emotional self-care.  Plan: Eat three meals per day, encourage snack before bed to include carb/protein Aim for 3 Carb Choices per meal (45 grams) +/- 1 either way  Aim for 0-1 Carbs per snack if hungry  Consider reading food labels for Total Carbohydrate and Fat Grams of foods Consider  increasing your activity level by walking for 30  minutes daily as tolerated Consider checking BG at alternate times per day to include FBS & 2h after a meal as directed by MD  Continue taking medication  as directed by MD  Always want to have water and a protein/carb snack with you Have glucose tablets available, consider having at bedside.  Consider eating [place] bacon Cheerios 1C, Milk & 1/2 banana = 45g carbs  1 1/2 C Special K cereal & milk  Consider reduced fat salad dressing / check for Total Carbs   Teaching Method Utilized:  Visual Auditory Hands on  Handouts given during visit include: Living Well with Diabetes Carb Counting and Food Label handouts Meal Plan Card My Plate  Snack sheet  Barriers to learning/adherence to lifestyle change: Overall health condition  Diabetes self-care  support plan:   [facility] support group  Demonstrated degree of understanding via:  Teach Back    Monitoring/Evaluation:  Dietary intake, exercise, Test glucose, and body weight, return prn.

## 2013-10-31 NOTE — Patient Instructions (Addendum)
Plan:  Aim for 3 Carb Choices per meal (45 grams) +/- 1 either way  Aim for 0-1 Carbs per snack if hungry  Consider reading food labels for Total Carbohydrate and Fat Grams of foods Consider  increasing your activity level by walking for 30  minutes daily as tolerated Consider checking BG at alternate times per day to include FBS & 2h after a meal as directed by MD  Continue taking medication  as directed by MD  Always want to have water and a protein/carb snack with you Have glucose tablets available, consider having at bedside.  Consider eating [place] bacon Cheerios 1C, Milk & 1/2 banana = 45g carbs 1 1/2 C Special K cereal & milk

## 2013-11-12 ENCOUNTER — Telehealth: Payer: Self-pay | Admitting: *Deleted

## 2013-11-18 ENCOUNTER — Ambulatory Visit
Admission: RE | Admit: 2013-11-18 | Discharge: 2013-11-18 | Disposition: A | Discharge status: Home / Self Care | Payer: Medicare Other | Source: Ambulatory Visit

## 2013-11-18 DIAGNOSIS — Z9889 Other specified postprocedural states: Secondary | ICD-10-CM

## 2013-11-18 DIAGNOSIS — Z1231 Encounter for screening mammogram for malignant neoplasm of breast: Secondary | ICD-10-CM

## 2013-11-27 ENCOUNTER — Encounter: Payer: Self-pay | Admitting: Pulmonary Disease

## 2013-11-27 ENCOUNTER — Ambulatory Visit (INDEPENDENT_AMBULATORY_CARE_PROVIDER_SITE_OTHER): Payer: Medicare Other | Admitting: Pulmonary Disease

## 2013-11-27 VITALS — BP 140/92 | HR 80 | Ht 64.5 in | Wt 217.0 lb

## 2013-11-27 DIAGNOSIS — M25473 Effusion, unspecified ankle: Secondary | ICD-10-CM

## 2013-11-27 DIAGNOSIS — J441 Chronic obstructive pulmonary disease with (acute) exacerbation: Secondary | ICD-10-CM

## 2013-11-27 DIAGNOSIS — M25476 Effusion, unspecified foot: Secondary | ICD-10-CM

## 2013-11-27 DIAGNOSIS — R0602 Shortness of breath: Secondary | ICD-10-CM

## 2013-11-27 MED ORDER — E-Z SPACER DEVI: Status: DC

## 2013-11-27 MED ORDER — E-Z SPACER DEVI: Status: AC

## 2013-11-27 NOTE — Assessment & Plan Note (Signed)
This with worsening dyspnea is worrisome for heart failure.  Plan: -echo

## 2013-11-27 NOTE — Assessment & Plan Note (Signed)
Based on today's simple spirometry she does not have airflow obstruction consistent with COPD.  However, her Chest X-ray shows increased AP diameter which with her long smoking history makes me consider emphysema.    So I think we need to get full PFTs before we say that she does not have COPD.  We also need to consider other causes of dyspnea such as CHF, pulmonary hypertension, obesity/deconditioning and other lung diseases.  Plan: -continue Symbicort with a spacer for now -full PFT -echo -f/u after these tests

## 2013-11-27 NOTE — Progress Notes (Signed)
Subjective:    Patient ID: [name], female    DOB: Aug 24, 1946, 68 y.o.   MRN: [id number]  HPI  Recently [name] has been having trouble breathing.  She notes that in 2004 she started having some difficulty breathing and then things acceleratd in 2008.  She was going to [facility] for a while and she would receive breahting treatments. She was also teated wth inhalers which she says didn't help.  In February she had to go to the hospital for admission and needed breathing treatments, steroids, and antibiotics.   She was admitted to [facility] and this is the first visit with a pulmonologist in many years.  She was told that she had a COPD exacerbation.  Prior to this she had been told that she had Asthma and bronchitis.  She used to smoke cigarettes and quit in 2008 after 40 years of smoking. She smoked more than 0.5 ppd but never 1ppd.    Her childhood was normal without respiraotry problems.  Her mother had asthma growing up.  Recently was the only admission she has every had for a respiratory problem.  Typically in the Spring she will wheeze more and make a lot of mucus.  It is commonly worse in the nighttime.  She notes that pollen and dust will make her more short of breath.  Some cleaning solutions have the same effects.  She does not feel sinus congestion or a runny nose.    She currently takes ProAir frequently throughout the day.  She typically uses it twice per day and it helps when she takes it.    Past Medical History  Diagnosis Date  . Hyperlipidemia   . Thyroid disease   . Hypertension   . Ear infection   . Sinus problem   . CHF (congestive heart failure)   . Diverticula, intestine   . Complication of anesthesia     had bronch spasms post bladder surgery-had neb tx-that was the only time this happened  . Wears glasses   . Wears dentures     top-partial lower  . PONV (postoperative nausea and vomiting)   . Stroke 2013  . Hypothyroidism   . Depression   . Anxiety    . GERD (gastroesophageal reflux disease)   . Sickle cell anemia     trait  . COPD (chronic obstructive pulmonary disease)   . Shortness of breath   . Diabetes mellitus     fasting blood sugar 130s     Family History  Problem Relation Age of Onset  . Heart disease Mother   . Hypertension Mother   . Diabetes Mother   . Heart disease Father   . Hypertension Father   . Diabetes Father   . Heart disease Sister   . Hypertension Sister   . Diabetes Sister   . Hypertension Brother   . Cancer Brother   . Diabetes Sister   . Hypertension Sister   . Diabetes Sister   . Hypertension Sister      History   Social History  . Marital Status: Widowed    Spouse Name: N/A    Number of Children: N/A  . Years of Education: N/A   Occupational History  . Not on file.   Social History Main Topics  . Smoking status: Former Smoker -- 0.50 packs/day for 40 years    Types: Cigarettes    Quit date: 07/12/2007  . Smokeless tobacco: Never Used  . Alcohol Use: Yes  Comment: occasional  . Drug Use: No  . Sexual Activity: Yes    Birth Control/ Protection: Surgical   Other Topics Concern  . Not on file   Social History Narrative  . No narrative on file     Allergies  Allergen Reactions  . Sulfonamide Derivatives Shortness Of Breath  . Codeine Itching     Outpatient Prescriptions Prior to Visit  Medication Sig Dispense Refill  . amLODipine (NORVASC) 10 MG tablet Take 1 tablet (10 mg total) by mouth daily.  30 tablet  3  . clopidogrel (PLAVIX) 75 MG tablet Take 1 tablet (75 mg total) by mouth daily.  30 tablet  3  . docusate sodium (COLACE) 100 MG capsule Take 1 capsule (100 mg total) by mouth 2 (two) times daily.  30 capsule  0  . guaiFENesin (MUCINEX) 600 MG 12 hr tablet Take 1 tablet (600 mg total) by mouth 2 (two) times daily.  40 tablet  0  . hydrochlorothiazide (HYDRODIURIL) 25 MG tablet Take 1 tablet (25 mg total) by mouth daily.  30 tablet  3  . insulin glargine (LANTUS)  100 UNIT/ML injection Inject 50 Units into the skin daily.  10 mL  3  . irbesartan (AVAPRO) 150 MG tablet Take 150 mg by mouth daily.      . montelukast (SINGULAIR) 10 MG tablet Take 10 mg by mouth at bedtime.      . pantoprazole (PROTONIX) 40 MG tablet Take 40 mg by mouth daily.      Marland Kitchen PROAIR HFA 108 (90 BASE) MCG/ACT inhaler Inhale 1 puff into the lungs every 6 (six) hours as needed for wheezing or shortness of breath.       . SYNTHROID 112 MCG tablet Take 1 tablet (112 mcg total) by mouth daily.  30 tablet  3  . glimepiride (AMARYL) 4 MG tablet Take 1 tablet (4 mg total) by mouth daily before breakfast.  30 tablet  3  . budesonide-formoterol (SYMBICORT) 160-4.5 MCG/ACT inhaler Inhale 2 puffs into the lungs 2 (two) times daily.  1 Inhaler  2  . pravastatin (PRAVACHOL) 10 MG tablet Take 20 mg by mouth at bedtime.       Marland Kitchen tiotropium (SPIRIVA) 18 MCG inhalation capsule Place 1 capsule (18 mcg total) into inhaler and inhale daily.  30 capsule  2  . fluconazole (DIFLUCAN) 100 MG tablet Take 1 tablet (100 mg total) by mouth daily.  4 tablet  0  . predniSONE (DELTASONE) 20 MG tablet Take 2 tablet by mouth daily X 2 days; then 1 tablet by mouth X 3 days; then 1/2 tablet by mouth daily X 3 days and stop prednisone  10 tablet  0   No facility-administered medications prior to visit.      Review of Systems  Constitutional: Negative for fever and unexpected weight change.  HENT: Positive for congestion and postnasal drip. Negative for dental problem, ear pain, nosebleeds, rhinorrhea, sinus pressure, sneezing, sore throat and trouble swallowing.   Eyes: Negative for redness and itching.  Respiratory: Positive for cough, shortness of breath and wheezing. Negative for chest tightness.   Cardiovascular: Negative for palpitations and leg swelling.  Gastrointestinal: Negative for nausea and vomiting.  Genitourinary: Negative for dysuria.  Musculoskeletal: Negative for joint swelling.  Skin: Negative for  rash.  Neurological: Negative for headaches.  Hematological: Does not bruise/bleed easily.  Psychiatric/Behavioral: Negative for dysphoric mood. The patient is not nervous/anxious.        Objective:   Physical Exam  Filed Vitals:   11/27/13 1425  BP: 140/92  Pulse: 80  Height: 5' 4.5" (1.638 m)  Weight: 217 lb (98.431 kg)  SpO2: 96%  RA   Gen: obese but well appearing, no acute distress HEENT: NCAT, PERRL, EOMi, OP clear, neck supple without masses PULM: CTA B CV: RRR, no mgr, no JVD AB: BS+, soft, nontender, no hsm Ext: warm, trace ankle edema, no clubbing, no cyanosis Derm: no rash or skin breakdown Neuro: A&Ox4, CN II-XII intact, strength 5/5 in all 4 extremities  10/2013 CXR reviewed> increased AP diameter, no clear infiltrate 11/27/2013 Simple spirometry > normal     Assessment & Plan:   Shortness of breath Based on today's simple spirometry she does not have airflow obstruction consistent with COPD.  However, her Chest X-ray shows increased AP diameter which with her long smoking history makes me consider emphysema.    So I think we need to get full PFTs before we say that she does not have COPD.  We also need to consider other causes of dyspnea such as CHF, pulmonary hypertension, obesity/deconditioning and other lung diseases.  Plan: -continue Symbicort with a spacer for now -full PFT -echo -f/u after these tests  Ankle swelling This with worsening dyspnea is worrisome for heart failure.  Plan: -echo    Updated Medication List Outpatient Encounter Prescriptions as of 11/27/2013  Medication Sig  . amLODipine (NORVASC) 10 MG tablet Take 1 tablet (10 mg total) by mouth daily.  . clopidogrel (PLAVIX) 75 MG tablet Take 1 tablet (75 mg total) by mouth daily.  Marland Kitchen. docusate sodium (COLACE) 100 MG capsule Take 1 capsule (100 mg total) by mouth 2 (two) times daily.  Marland Kitchen. glimepiride (AMARYL) 4 MG tablet Take 4 mg by mouth 2 (two) times daily.  Marland Kitchen. guaiFENesin  (MUCINEX) 600 MG 12 hr tablet Take 1 tablet (600 mg total) by mouth 2 (two) times daily.  . hydrochlorothiazide (HYDRODIURIL) 25 MG tablet Take 1 tablet (25 mg total) by mouth daily.  . insulin glargine (LANTUS) 100 UNIT/ML injection Inject 50 Units into the skin daily.  . irbesartan (AVAPRO) 150 MG tablet Take 150 mg by mouth daily.  . montelukast (SINGULAIR) 10 MG tablet Take 10 mg by mouth at bedtime.  . pantoprazole (PROTONIX) 40 MG tablet Take 40 mg by mouth daily.  Marland Kitchen. PROAIR HFA 108 (90 BASE) MCG/ACT inhaler Inhale 1 puff into the lungs every 6 (six) hours as needed for wheezing or shortness of breath.   . SYNTHROID 112 MCG tablet Take 1 tablet (112 mcg total) by mouth daily.  . [DISCONTINUED] glimepiride (AMARYL) 4 MG tablet Take 1 tablet (4 mg total) by mouth daily before breakfast.  . budesonide-formoterol (SYMBICORT) 160-4.5 MCG/ACT inhaler Inhale 2 puffs into the lungs 2 (two) times daily.  . pravastatin (PRAVACHOL) 10 MG tablet Take 20 mg by mouth at bedtime.   Marland Kitchen. Spacer/Aero-Holding Chambers (E-Z SPACER) inhaler Use as instructed  . tiotropium (SPIRIVA) 18 MCG inhalation capsule Place 1 capsule (18 mcg total) into inhaler and inhale daily.  . [DISCONTINUED] fluconazole (DIFLUCAN) 100 MG tablet Take 1 tablet (100 mg total) by mouth daily.  . [DISCONTINUED] predniSONE (DELTASONE) 20 MG tablet Take 2 tablet by mouth daily X 2 days; then 1 tablet by mouth X 3 days; then 1/2 tablet by mouth daily X 3 days and stop prednisone

## 2013-11-27 NOTE — Addendum Note (Signed)
Addended by: Velvet BatheAULFIELD, ASHLEY L on: 11/27/2013 03:12 PM   Modules accepted: Orders

## 2013-11-27 NOTE — Patient Instructions (Signed)
We will arrange full pulmonary function testing, and an echocardiogram for you Use symbicort 2 puffs twice a day with a spacer, call us if you need a refill  We will see you back in 2 months or sooner if needed

## 2013-12-10 ENCOUNTER — Ambulatory Visit (HOSPITAL_COMMUNITY): Payer: Medicare Other | Attending: Cardiovascular Disease | Admitting: Radiology

## 2013-12-10 ENCOUNTER — Other Ambulatory Visit (HOSPITAL_COMMUNITY): Payer: Self-pay | Admitting: Radiology

## 2013-12-10 ENCOUNTER — Encounter: Payer: Self-pay | Admitting: Pulmonary Disease

## 2013-12-10 DIAGNOSIS — I509 Heart failure, unspecified: Secondary | ICD-10-CM

## 2013-12-10 DIAGNOSIS — I359 Nonrheumatic aortic valve disorder, unspecified: Secondary | ICD-10-CM

## 2013-12-10 NOTE — Progress Notes (Signed)
Echocardiogram Performed. 

## 2013-12-11 ENCOUNTER — Telehealth: Payer: Self-pay

## 2013-12-11 NOTE — Telephone Encounter (Signed)
Message copied by Velvet BatheAULFIELD, ASHLEY L on Thu Dec 11, 2013  9:40 AM ------      Message from: Max FickleMCQUAID, DOUGLAS B      Created: Wed Dec 10, 2013  8:44 PM       A            Please let her know that this was normal            Thanks      B ------

## 2013-12-11 NOTE — Telephone Encounter (Signed)
Pt aware of results.  Nothing further needed.  

## 2013-12-23 NOTE — Telephone Encounter (Signed)
Telephone encounter.

## 2014-01-06 ENCOUNTER — Telehealth: Payer: Self-pay | Admitting: Pulmonary Disease

## 2014-01-06 NOTE — Telephone Encounter (Signed)
lmomtcb x1 for wanda

## 2014-01-07 NOTE — Telephone Encounter (Signed)
Spoke with [name] Needing to know current dx for patient last OV and what test she has coming up this week with our office. >>> COPD with Acute Exacerbation, PFT 01/08/14 and OV 01/09/14 with BQ to review Asking also who referred her to have ECHO done--results? >>> Pt referred to have ECHO by Dr Virl Sonammy Boyd, results NORMAL  Nothing further needed.

## 2014-01-08 ENCOUNTER — Encounter (INDEPENDENT_AMBULATORY_CARE_PROVIDER_SITE_OTHER): Payer: Self-pay

## 2014-01-08 ENCOUNTER — Ambulatory Visit (INDEPENDENT_AMBULATORY_CARE_PROVIDER_SITE_OTHER): Payer: Medicare Other | Admitting: Pulmonary Disease

## 2014-01-08 ENCOUNTER — Ambulatory Visit: Payer: Medicare Other | Admitting: Pulmonary Disease

## 2014-01-08 DIAGNOSIS — J441 Chronic obstructive pulmonary disease with (acute) exacerbation: Secondary | ICD-10-CM

## 2014-01-08 NOTE — Progress Notes (Signed)
PFT done today. 

## 2014-01-09 ENCOUNTER — Ambulatory Visit (INDEPENDENT_AMBULATORY_CARE_PROVIDER_SITE_OTHER): Payer: Medicare Other | Admitting: Pulmonary Disease

## 2014-01-09 ENCOUNTER — Encounter: Payer: Self-pay | Admitting: Pulmonary Disease

## 2014-01-09 VITALS — BP 142/74 | HR 76 | Ht 63.5 in | Wt 211.0 lb

## 2014-01-09 DIAGNOSIS — R0602 Shortness of breath: Secondary | ICD-10-CM

## 2014-01-09 DIAGNOSIS — R05 Cough: Secondary | ICD-10-CM

## 2014-01-09 DIAGNOSIS — R059 Cough, unspecified: Secondary | ICD-10-CM

## 2014-01-09 NOTE — Progress Notes (Signed)
Subjective:    Patient ID: [name], female    DOB: 11-30-1945, 68 y.o.   MRN: [id number]  Synopsis: Former smoker referred to us in 2015 for evaluation of shortness of breath. The only function testing and echocardiogram are normal  HPI  01/09/2014 routine office visit> [name] has been doing well since the last visit. However, she continues to have the sensation of a tickling in her throat. This leads to cough on occasion. She knows that she still has acid reflux. The cough is worse at night. She has slight mucus production at night. She has not had much in the way of shortness of breath. She continues to take her inhalers but she's not sure that they're doing anything.  Past Medical History  Diagnosis Date  . Hyperlipidemia   . Thyroid disease   . Hypertension   . Ear infection   . Sinus problem   . CHF (congestive heart failure)   . Diverticula, intestine   . Complication of anesthesia     had bronch spasms post bladder surgery-had neb tx-that was the only time this happened  . Wears glasses   . Wears dentures     top-partial lower  . PONV (postoperative nausea and vomiting)   . Stroke 2013  . Hypothyroidism   . Depression   . Anxiety   . GERD (gastroesophageal reflux disease)   . Sickle cell anemia     trait  . COPD (chronic obstructive pulmonary disease)   . Shortness of breath   . Diabetes mellitus     fasting blood sugar 130s     Review of Systems     Objective:   Physical Exam  Filed Vitals:   01/09/14 1636  BP: 142/74  Pulse: 76  Height: 5' 3.5" (1.613 m)  Weight: 211 lb (95.709 kg)  SpO2: 95%   Gen: well appearing, no acute distress HEENT: NCAT, EOMi, OP clear PULM: CTA B CV: RRR, no mgr, no JVD AB: BS+, soft, nontender, no hsm Ext: warm, no edema, no clubbing, no cyanosis   11/27/2013 Simple spirometry > normal, no obstruction Ratio 80%, FEV1 1.88L (90% pred) 12/2013 TTE > LVEF 55-60%, RV size and function normal 01/08/2014 full  pulmonary function test> ratio 83%, FEV1 2.13 L (112% predicted 5% change with bronchodilator), total lung capacity 5.18 L (103% predicted), DLCO 20.68 (87% predicted).     Assessment & Plan:   Shortness of breath This lady does not have COPD asthma or clear evidence of heart disease.  The most likely explanation for her mild shortness of breath is obesity and deconditioning.  Plan:  -stop inhalers  COUGH Most likely due to acid reflux.  Acid reflux lifestyle modification reviewed  Continue protonic  Start Zantac at night   Updated Medication List Outpatient Encounter Prescriptions as of 01/09/2014  Medication Sig  . amLODipine (NORVASC) 10 MG tablet Take 1 tablet (10 mg total) by mouth daily.  . budesonide-formoterol (SYMBICORT) 160-4.5 MCG/ACT inhaler Inhale 2 puffs into the lungs 2 (two) times daily.  . clopidogrel (PLAVIX) 75 MG tablet Take 1 tablet (75 mg total) by mouth daily.  Marland Kitchen. docusate sodium (COLACE) 100 MG capsule Take 1 capsule (100 mg total) by mouth 2 (two) times daily.  Marland Kitchen. glimepiride (AMARYL) 4 MG tablet Take 4 mg by mouth 2 (two) times daily.  Marland Kitchen. guaiFENesin (MUCINEX) 600 MG 12 hr tablet Take 1 tablet (600 mg total) by mouth 2 (two) times daily.  . hydrochlorothiazide (HYDRODIURIL) 25  MG tablet Take 1 tablet (25 mg total) by mouth daily.  . insulin glargine (LANTUS) 100 UNIT/ML injection Inject 50 Units into the skin daily.  . irbesartan (AVAPRO) 150 MG tablet Take 300 mg by mouth daily.   . montelukast (SINGULAIR) 10 MG tablet Take 10 mg by mouth at bedtime.  . pantoprazole (PROTONIX) 40 MG tablet Take 40 mg by mouth daily.  . pravastatin (PRAVACHOL) 10 MG tablet Take 20 mg by mouth at bedtime.   Marland Kitchen. PROAIR HFA 108 (90 BASE) MCG/ACT inhaler Inhale 1 puff into the lungs every 6 (six) hours as needed for wheezing or shortness of breath.   . Spacer/Aero-Holding Chambers (E-Z SPACER) inhaler Use as instructed  . SYNTHROID 112 MCG tablet Take 1 tablet (112 mcg total) by  mouth daily.  Marland Kitchen. tiotropium (SPIRIVA) 18 MCG inhalation capsule Place 1 capsule (18 mcg total) into inhaler and inhale daily.

## 2014-01-09 NOTE — Assessment & Plan Note (Signed)
This lady does not have COPD asthma or clear evidence of heart disease.  The most likely explanation for her mild shortness of breath is obesity and deconditioning.  Plan:  -stop inhalers

## 2014-01-09 NOTE — Patient Instructions (Signed)
Take Zantac at night Follow the GERD diet we gave you  Stop taking your inhalers Follow up with your doctor

## 2014-01-09 NOTE — Assessment & Plan Note (Signed)
Most likely due to acid reflux.  Acid reflux lifestyle modification reviewed  Continue protonic  Start Zantac at night

## 2014-01-13 ENCOUNTER — Encounter: Payer: Self-pay | Admitting: Pulmonary Disease

## 2014-06-11 LAB — PULMONARY FUNCTION TEST
DL/VA % PRED: 103 %
DL/VA: 4.88 ml/min/mmHg/L
DLCO UNC % PRED: 87 %
DLCO unc: 20.68 ml/min/mmHg
FEF 25-75 Post: 2.62 L/sec
FEF 25-75 Pre: 2.1 L/sec
FEF2575-%Change-Post: 24 %
FEF2575-%Pred-Post: 144 %
FEF2575-%Pred-Pre: 116 %
FEV1-%CHANGE-POST: 5 %
FEV1-%PRED-POST: 112 %
FEV1-%Pred-Pre: 106 %
FEV1-POST: 2.13 L
FEV1-Pre: 2.03 L
FEV1FVC-%CHANGE-POST: 3 %
FEV1FVC-%Pred-Pre: 103 %
FEV6-%Change-Post: 1 %
FEV6-%PRED-PRE: 107 %
FEV6-%Pred-Post: 109 %
FEV6-PRE: 2.52 L
FEV6-Post: 2.56 L
FEV6FVC-%PRED-POST: 104 %
FEV6FVC-%PRED-PRE: 104 %
FVC-%Change-Post: 1 %
FVC-%Pred-Post: 104 %
FVC-%Pred-Pre: 103 %
FVC-Post: 2.56 L
FVC-Pre: 2.52 L
POST FEV6/FVC RATIO: 100 %
PRE FEV1/FVC RATIO: 81 %
PRE FEV6/FVC RATIO: 100 %
Post FEV1/FVC ratio: 83 %
RV % PRED: 93 %
RV: 1.94 L
TLC % PRED: 103 %
TLC: 5.18 L

## 2014-07-06 ENCOUNTER — Encounter: Payer: Self-pay | Admitting: Pulmonary Disease

## 2014-11-23 ENCOUNTER — Other Ambulatory Visit: Payer: Self-pay

## 2014-11-23 DIAGNOSIS — Z1231 Encounter for screening mammogram for malignant neoplasm of breast: Secondary | ICD-10-CM

## 2014-12-11 ENCOUNTER — Ambulatory Visit
Admission: RE | Admit: 2014-12-11 | Discharge: 2014-12-11 | Disposition: A | Discharge status: Home / Self Care | Payer: Medicare HMO | Source: Ambulatory Visit

## 2014-12-11 DIAGNOSIS — Z1231 Encounter for screening mammogram for malignant neoplasm of breast: Secondary | ICD-10-CM

## 2015-01-07 ENCOUNTER — Other Ambulatory Visit: Payer: Self-pay | Admitting: Family Medicine

## 2015-01-07 DIAGNOSIS — Z13820 Encounter for screening for osteoporosis: Secondary | ICD-10-CM

## 2015-04-13 IMAGING — CR DG FOREARM 2V*L*
3 series · 3 of 3 positions shown · non-contrast
Comparison: None.

CLINICAL DATA: Pain post fall

LEFT FOREARM - 2 VIEW

[x forearm ap left]
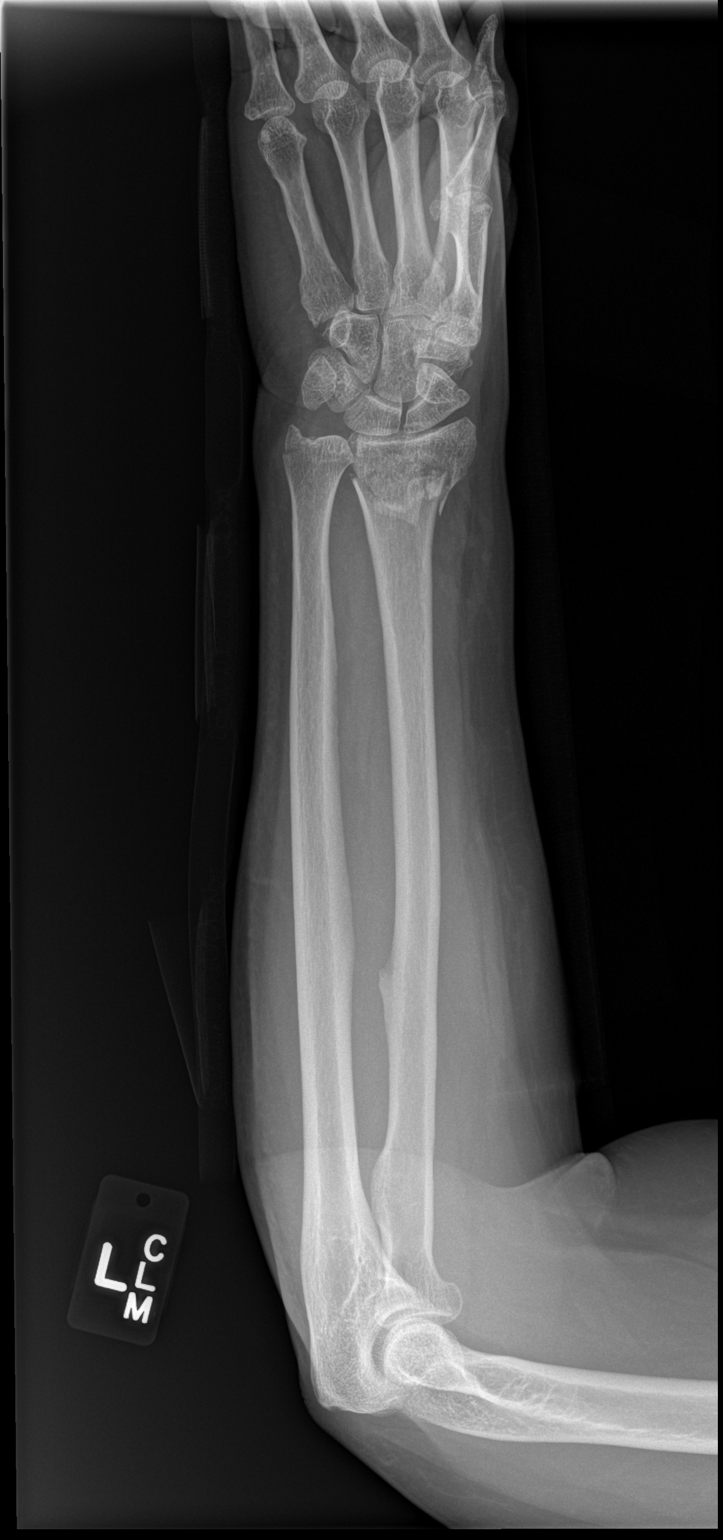

[x forearm lat left (1 of 2)]
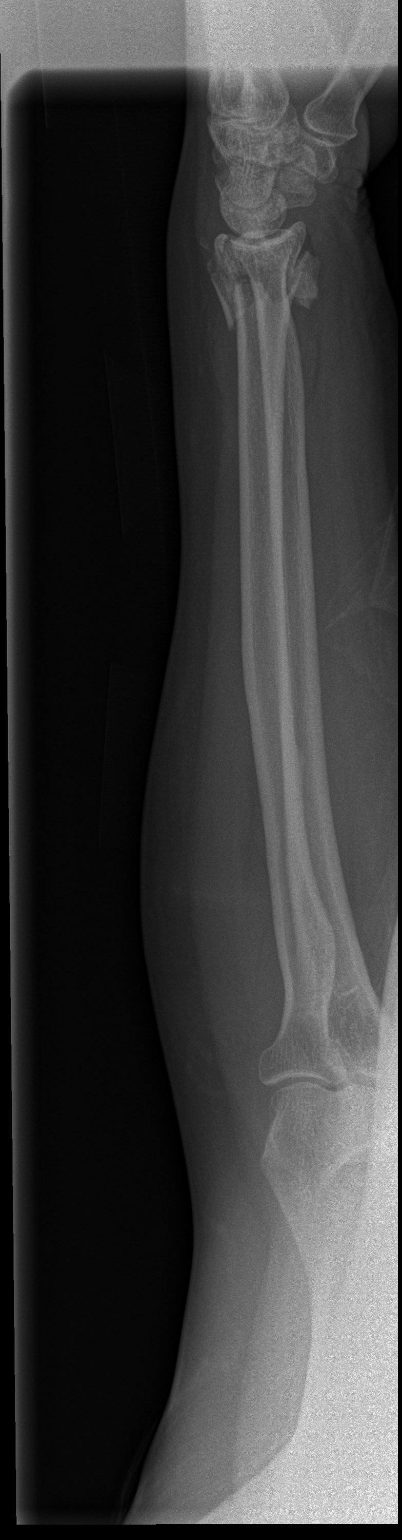

[x forearm lat left (2 of 2)]
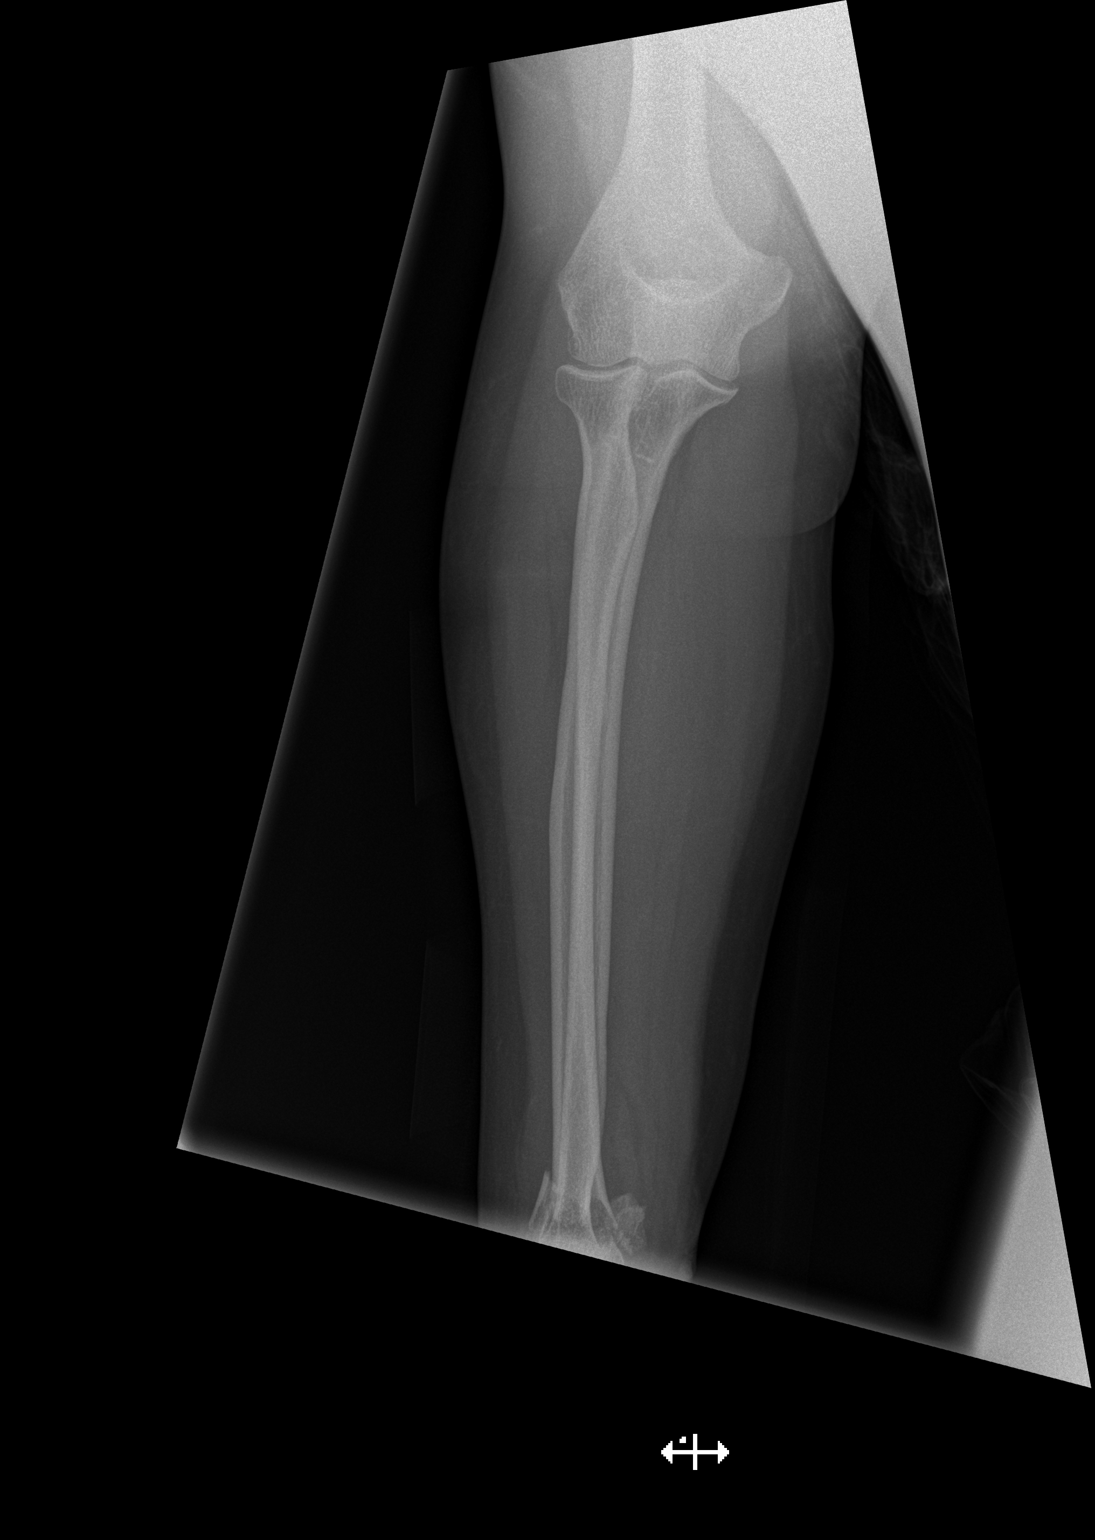

[3 of 3 positions shown; findings below may reference images not displayed]

FINDINGS: Transverse comminuted fracture of the distal radius with
extension to the distal radial articular surface with 2 mm
distraction  of the subchondral cortex fragments.  There is neutral
angulation of the distal radial articular surface.  There is a
minimally-displaced ulnar styloid fracture.  Carpal rows appear
intact.]
IMPRESSION: [

1.  Comminuted intra-articular distal radial fracture with neutral
angulation of the distal radial articular surface.]

2.  Ulnar styloid avulsion fracture.

## 2015-04-13 IMAGING — CR DG WRIST COMPLETE 3+V*L*
4 series · 4 of 4 positions shown · non-contrast
Comparison: None.

CLINICAL DATA: Pain post fall

LEFT WRIST - COMPLETE 3+ VIEW

[x wrist pa left]
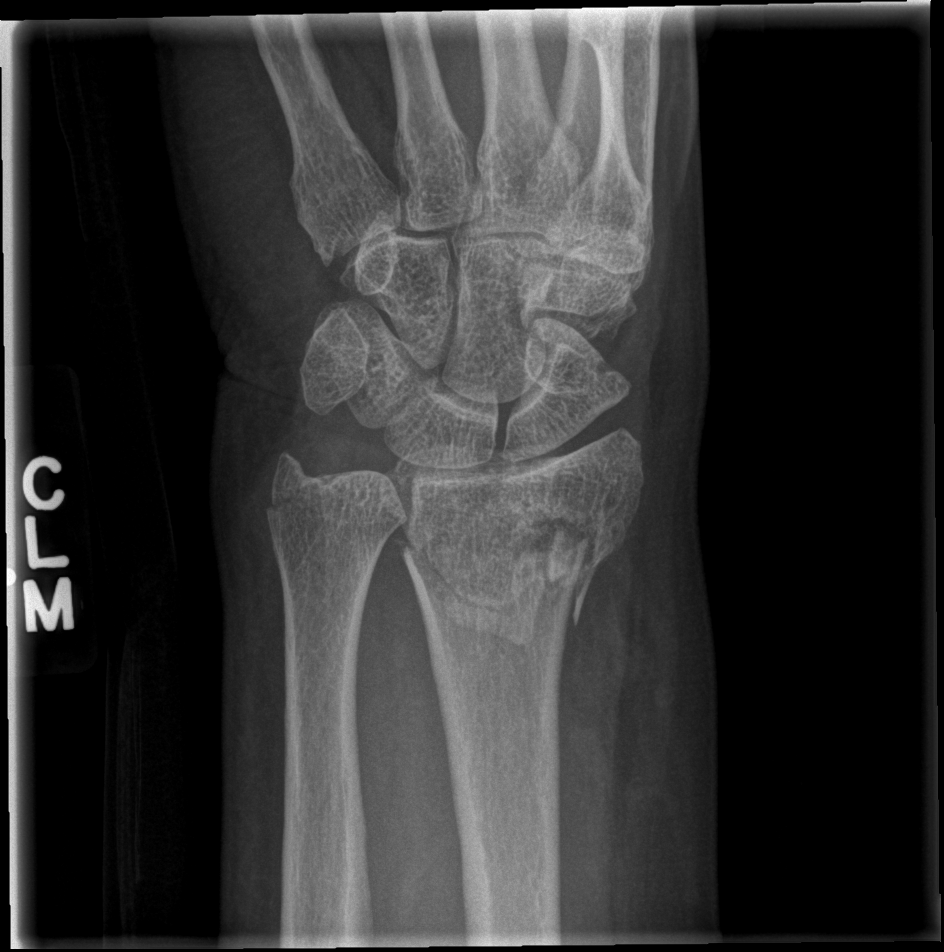

[x wrist obl left (1 of 2)]
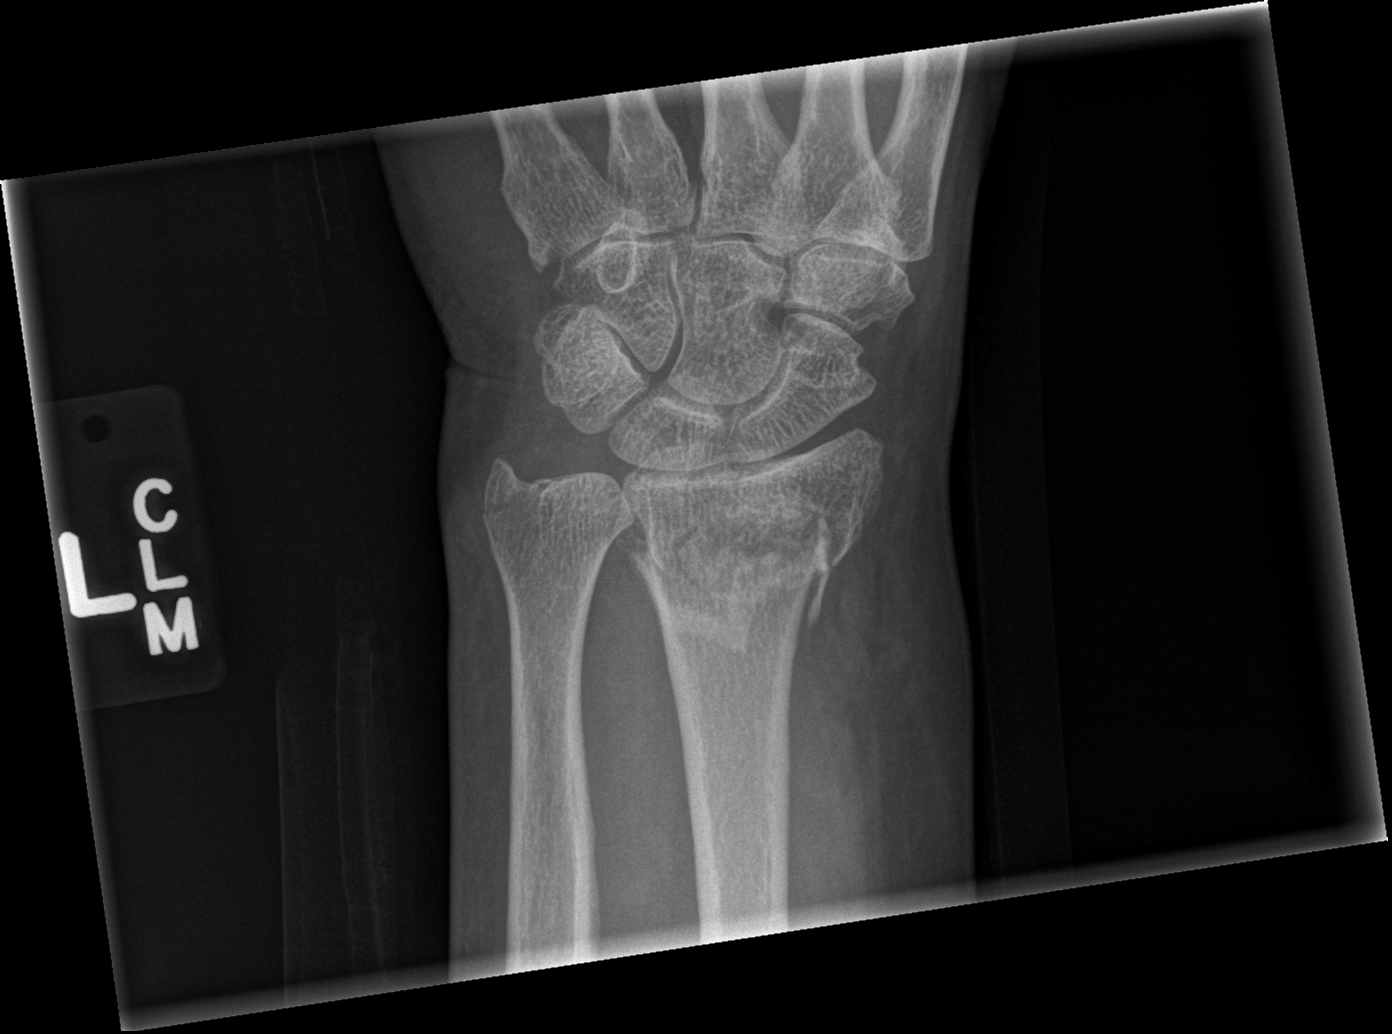

[x wrist obl left (2 of 2)]
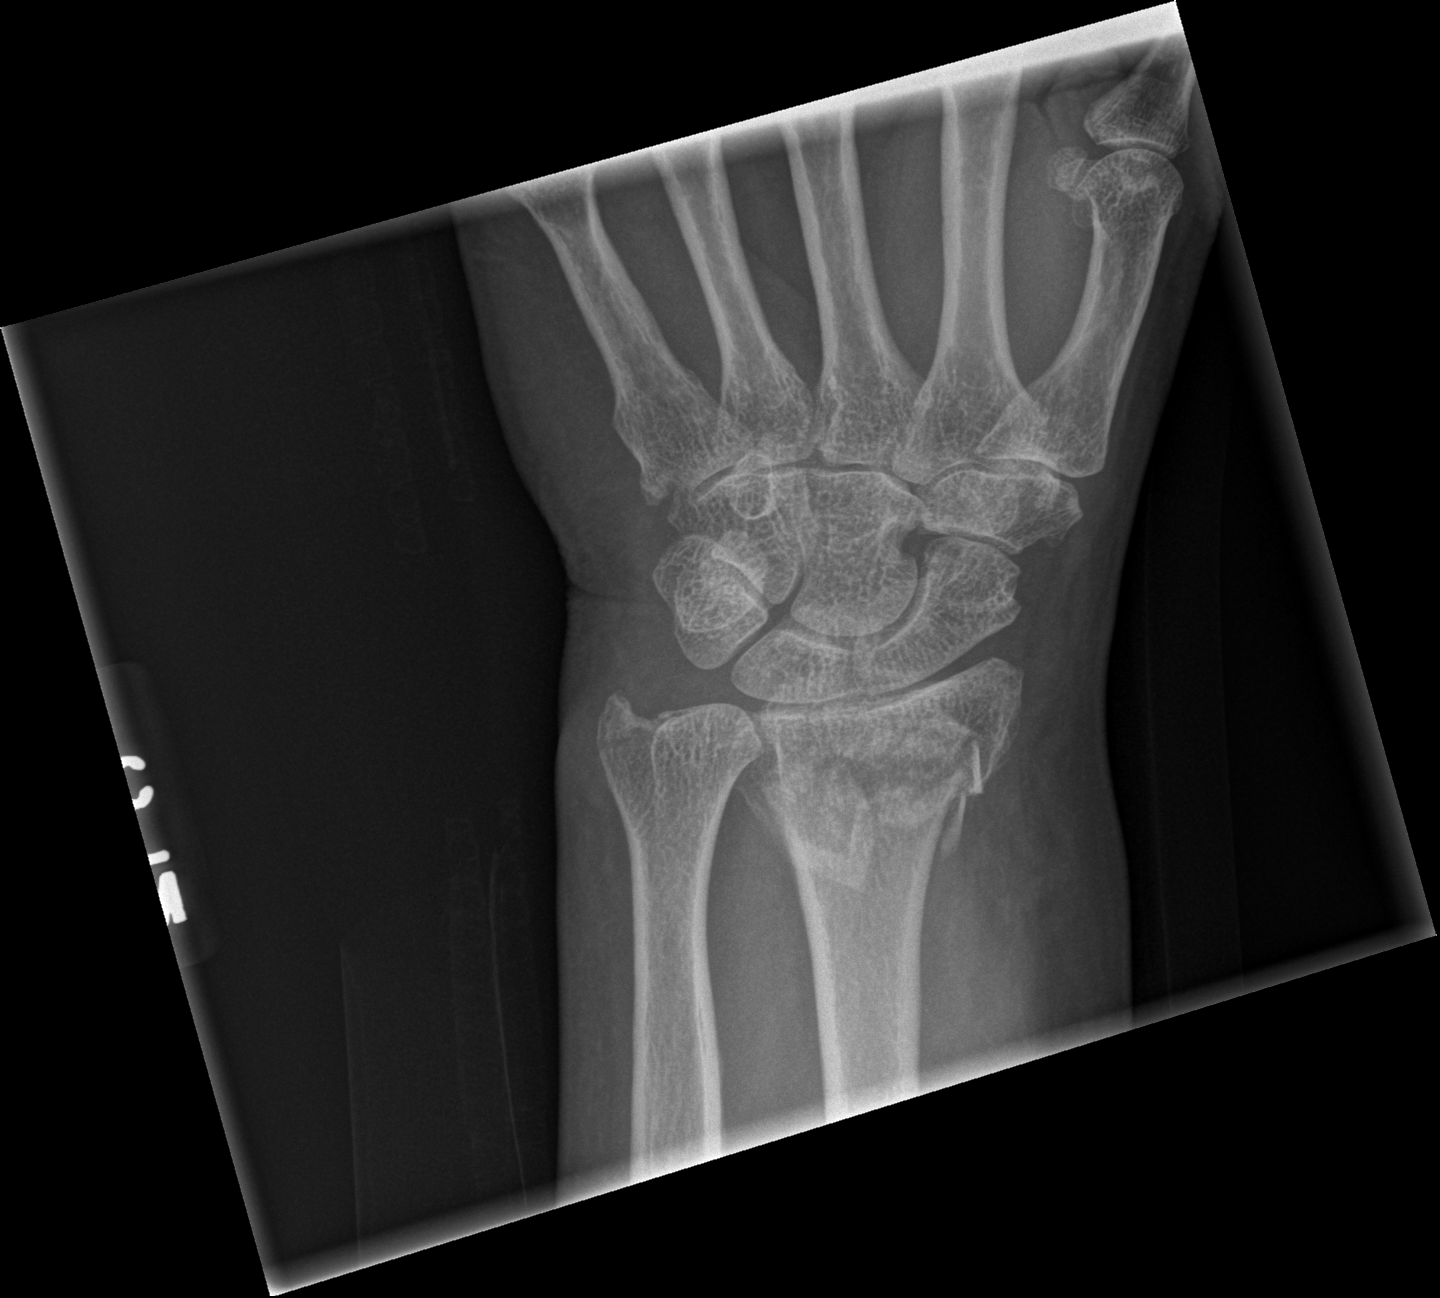

[x wrist lat left]
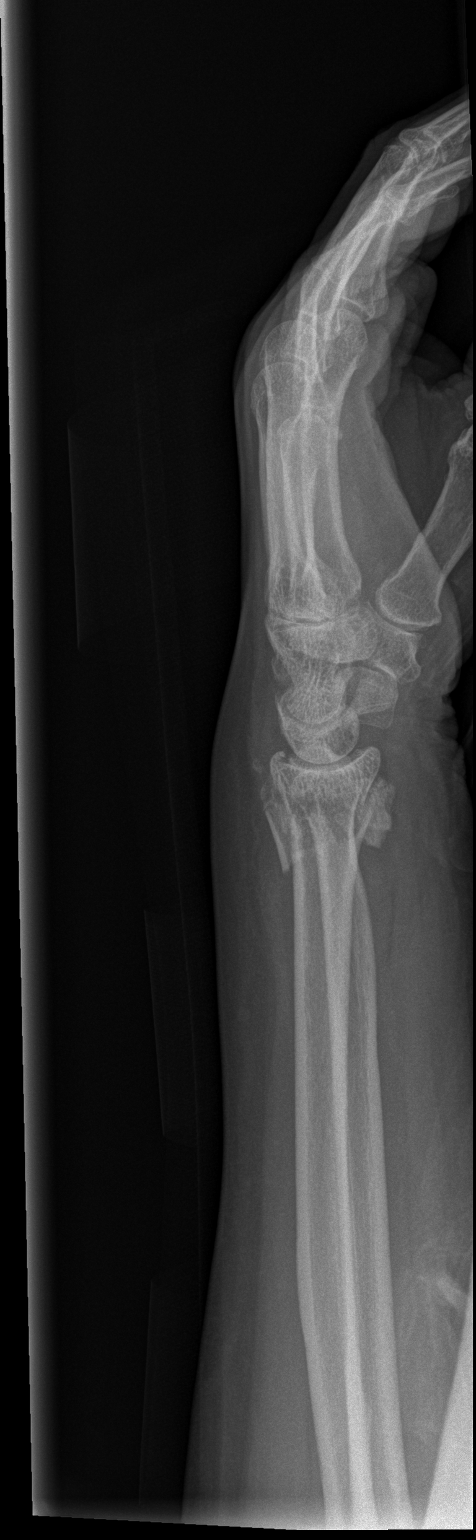

[4 of 4 positions shown; findings below may reference images not displayed]

FINDINGS: Transverse comminuted fracture of the distal radius with
extension to the distal radial articular surface but no significant
distraction or step-off deformity of the subchondral cortex.  There
is neutral angulation of the distal radial articular surface.
There is a minimally-displaced ulnar styloid fracture.  Carpal rows
appear intact.
IMPRESSION: 1.  Comminuted intra-articular distal radial fracture with neutral
angulation of the distal radial articular surface.
2.  Ulnar styloid avulsion fracture.

## 2016-05-12 ENCOUNTER — Other Ambulatory Visit: Payer: Self-pay | Admitting: Family Medicine

## 2016-05-12 DIAGNOSIS — Z1231 Encounter for screening mammogram for malignant neoplasm of breast: Secondary | ICD-10-CM

## 2016-05-23 ENCOUNTER — Ambulatory Visit: Payer: Medicare HMO

## 2016-05-24 ENCOUNTER — Ambulatory Visit: Payer: Medicare HMO

## 2016-05-26 ENCOUNTER — Ambulatory Visit
Admission: RE | Admit: 2016-05-26 | Discharge: 2016-05-26 | Disposition: A | Discharge status: Home / Self Care | Payer: Medicare HMO | Source: Ambulatory Visit | Attending: Family Medicine | Admitting: Family Medicine

## 2016-05-26 DIAGNOSIS — Z1231 Encounter for screening mammogram for malignant neoplasm of breast: Secondary | ICD-10-CM

## 2017-04-16 ENCOUNTER — Other Ambulatory Visit: Payer: Self-pay | Admitting: Internal Medicine

## 2017-04-16 DIAGNOSIS — Z1231 Encounter for screening mammogram for malignant neoplasm of breast: Secondary | ICD-10-CM

## 2017-05-28 ENCOUNTER — Ambulatory Visit: Payer: Medicare HMO

## 2017-06-15 ENCOUNTER — Ambulatory Visit: Payer: Medicare HMO

## 2017-07-05 ENCOUNTER — Ambulatory Visit
Admission: RE | Admit: 2017-07-05 | Discharge: 2017-07-05 | Disposition: A | Discharge status: Home / Self Care | Payer: Self-pay | Source: Ambulatory Visit | Attending: Internal Medicine | Admitting: Internal Medicine

## 2017-07-05 DIAGNOSIS — Z1231 Encounter for screening mammogram for malignant neoplasm of breast: Secondary | ICD-10-CM

## 2017-07-09 ENCOUNTER — Other Ambulatory Visit: Payer: Self-pay | Admitting: Internal Medicine

## 2017-07-09 DIAGNOSIS — R928 Other abnormal and inconclusive findings on diagnostic imaging of breast: Secondary | ICD-10-CM

## 2017-07-11 ENCOUNTER — Ambulatory Visit
Admission: RE | Admit: 2017-07-11 | Discharge: 2017-07-11 | Disposition: A | Discharge status: Home / Self Care | Payer: Medicare PPO | Source: Ambulatory Visit | Attending: Internal Medicine | Admitting: Internal Medicine

## 2017-07-11 DIAGNOSIS — R928 Other abnormal and inconclusive findings on diagnostic imaging of breast: Secondary | ICD-10-CM

## 2020-04-05 ENCOUNTER — Other Ambulatory Visit: Payer: Self-pay | Admitting: Family Medicine

## 2020-04-05 DIAGNOSIS — Z1231 Encounter for screening mammogram for malignant neoplasm of breast: Secondary | ICD-10-CM

## 2020-04-16 ENCOUNTER — Ambulatory Visit
Admission: RE | Admit: 2020-04-16 | Discharge: 2020-04-16 | Disposition: A | Discharge status: Home / Self Care | Payer: Medicare PPO | Source: Ambulatory Visit | Attending: Family Medicine | Admitting: Family Medicine

## 2020-04-16 ENCOUNTER — Other Ambulatory Visit: Payer: Self-pay

## 2020-04-16 DIAGNOSIS — Z1231 Encounter for screening mammogram for malignant neoplasm of breast: Secondary | ICD-10-CM

## 2020-04-20 ENCOUNTER — Other Ambulatory Visit: Payer: Self-pay | Admitting: Family Medicine

## 2020-04-20 DIAGNOSIS — N63 Unspecified lump in unspecified breast: Secondary | ICD-10-CM

## 2020-05-18 ENCOUNTER — Ambulatory Visit
Admission: RE | Admit: 2020-05-18 | Discharge: 2020-05-18 | Disposition: A | Discharge status: Home / Self Care | Payer: 59 | Source: Ambulatory Visit | Attending: Family Medicine | Admitting: Family Medicine

## 2020-05-18 ENCOUNTER — Other Ambulatory Visit: Payer: Self-pay

## 2020-05-18 DIAGNOSIS — N63 Unspecified lump in unspecified breast: Secondary | ICD-10-CM

## 2021-03-03 ENCOUNTER — Other Ambulatory Visit: Payer: Self-pay | Admitting: Family Medicine

## 2021-03-03 DIAGNOSIS — Z1231 Encounter for screening mammogram for malignant neoplasm of breast: Secondary | ICD-10-CM

## 2021-05-19 ENCOUNTER — Ambulatory Visit
Admission: RE | Admit: 2021-05-19 | Discharge: 2021-05-19 | Disposition: A | Discharge status: Home / Self Care | Payer: Medicare (Managed Care) | Source: Ambulatory Visit | Attending: Family Medicine | Admitting: Family Medicine

## 2021-05-19 ENCOUNTER — Other Ambulatory Visit: Payer: Self-pay

## 2021-05-19 DIAGNOSIS — Z1231 Encounter for screening mammogram for malignant neoplasm of breast: Secondary | ICD-10-CM

## 2022-04-26 ENCOUNTER — Other Ambulatory Visit: Payer: Self-pay | Admitting: Family Medicine

## 2022-04-26 DIAGNOSIS — N6002 Solitary cyst of left breast: Secondary | ICD-10-CM

## 2022-05-24 ENCOUNTER — Ambulatory Visit
Admission: RE | Admit: 2022-05-24 | Discharge: 2022-05-24 | Disposition: A | Discharge status: Home / Self Care | Payer: PRIVATE HEALTH INSURANCE | Source: Ambulatory Visit | Attending: Family Medicine | Admitting: Family Medicine

## 2022-05-24 ENCOUNTER — Ambulatory Visit
Admission: RE | Admit: 2022-05-24 | Discharge: 2022-05-24 | Disposition: A | Discharge status: Home / Self Care | Payer: 59 | Source: Ambulatory Visit | Attending: Family Medicine | Admitting: Family Medicine

## 2022-05-24 DIAGNOSIS — N6002 Solitary cyst of left breast: Secondary | ICD-10-CM

## 2022-11-20 ENCOUNTER — Other Ambulatory Visit: Payer: Self-pay | Admitting: Physical Medicine & Rehabilitation

## 2022-11-20 DIAGNOSIS — M5416 Radiculopathy, lumbar region: Secondary | ICD-10-CM

## 2022-12-18 ENCOUNTER — Ambulatory Visit
Admission: RE | Admit: 2022-12-18 | Discharge: 2022-12-18 | Disposition: A | Discharge status: Home / Self Care | Payer: Medicare HMO | Source: Ambulatory Visit | Attending: Physical Medicine & Rehabilitation | Admitting: Physical Medicine & Rehabilitation

## 2022-12-18 DIAGNOSIS — M5416 Radiculopathy, lumbar region: Secondary | ICD-10-CM

## 2023-04-25 ENCOUNTER — Other Ambulatory Visit: Payer: Self-pay | Admitting: Family Medicine

## 2023-04-25 DIAGNOSIS — Z0000 Encounter for general adult medical examination without abnormal findings: Secondary | ICD-10-CM

## 2023-06-05 ENCOUNTER — Ambulatory Visit
Admission: RE | Admit: 2023-06-05 | Discharge: 2023-06-05 | Disposition: A | Discharge status: Home / Self Care | Payer: Medicare HMO | Source: Ambulatory Visit | Attending: Family Medicine | Admitting: Family Medicine

## 2023-06-05 DIAGNOSIS — Z0000 Encounter for general adult medical examination without abnormal findings: Secondary | ICD-10-CM

## 2024-04-30 ENCOUNTER — Other Ambulatory Visit: Payer: Self-pay | Admitting: Family Medicine

## 2024-04-30 DIAGNOSIS — Z1231 Encounter for screening mammogram for malignant neoplasm of breast: Secondary | ICD-10-CM

## 2024-06-06 ENCOUNTER — Ambulatory Visit

## 2024-07-09 ENCOUNTER — Ambulatory Visit
